# Patient Record
Sex: Female | Born: 1950 | Race: White | Hispanic: No | Marital: Married | Smoking: Never smoker
Health system: Southern US, Community
[De-identification: ages and names within clinical notes are randomized; demographics above are authoritative.]

## PROBLEM LIST (undated history)

## (undated) DIAGNOSIS — M8580 Other specified disorders of bone density and structure, unspecified site: Secondary | ICD-10-CM

## (undated) DIAGNOSIS — M1990 Unspecified osteoarthritis, unspecified site: Secondary | ICD-10-CM

## (undated) DIAGNOSIS — S92909A Unspecified fracture of unspecified foot, initial encounter for closed fracture: Secondary | ICD-10-CM

## (undated) DIAGNOSIS — E119 Type 2 diabetes mellitus without complications: Secondary | ICD-10-CM

## (undated) DIAGNOSIS — N3090 Cystitis, unspecified without hematuria: Secondary | ICD-10-CM

## (undated) DIAGNOSIS — N809 Endometriosis, unspecified: Secondary | ICD-10-CM

## (undated) DIAGNOSIS — K219 Gastro-esophageal reflux disease without esophagitis: Secondary | ICD-10-CM

## (undated) HISTORY — DX: Type 2 diabetes mellitus without complications: E11.9

## (undated) HISTORY — PX: LAPAROSCOPY: SHX197

## (undated) HISTORY — DX: Other specified disorders of bone density and structure, unspecified site: M85.80

## (undated) HISTORY — DX: Cystitis, unspecified without hematuria: N30.90

## (undated) HISTORY — DX: Unspecified osteoarthritis, unspecified site: M19.90

## (undated) HISTORY — DX: Endometriosis, unspecified: N80.9

## (undated) HISTORY — PX: DILATION AND CURETTAGE OF UTERUS: SHX78

## (undated) HISTORY — DX: Gastro-esophageal reflux disease without esophagitis: K21.9

## (undated) HISTORY — PX: CHOLECYSTECTOMY: SHX55

## (undated) HISTORY — DX: Unspecified fracture of unspecified foot, initial encounter for closed fracture: S92.909A

---

## 1998-07-07 ENCOUNTER — Other Ambulatory Visit: Admission: RE | Admit: 1998-07-07 | Discharge: 1998-07-07 | Payer: Self-pay | Admitting: Family Medicine

## 1999-08-06 ENCOUNTER — Encounter: Admission: RE | Admit: 1999-08-06 | Discharge: 1999-08-06 | Payer: Self-pay | Admitting: Family Medicine

## 1999-08-06 ENCOUNTER — Encounter: Payer: Self-pay | Admitting: Family Medicine

## 1999-10-15 ENCOUNTER — Other Ambulatory Visit: Admission: RE | Admit: 1999-10-15 | Discharge: 1999-10-15 | Payer: Self-pay | Admitting: Family Medicine

## 2004-05-16 ENCOUNTER — Encounter: Admission: RE | Admit: 2004-05-16 | Discharge: 2004-05-16 | Payer: Self-pay | Admitting: Family Medicine

## 2004-05-22 ENCOUNTER — Encounter: Admission: RE | Admit: 2004-05-22 | Discharge: 2004-05-22 | Payer: Self-pay | Admitting: Family Medicine

## 2004-06-22 ENCOUNTER — Other Ambulatory Visit: Admission: RE | Admit: 2004-06-22 | Discharge: 2004-06-22 | Payer: Self-pay | Admitting: Obstetrics and Gynecology

## 2005-05-29 ENCOUNTER — Encounter: Admission: RE | Admit: 2005-05-29 | Discharge: 2005-05-29 | Payer: Self-pay | Admitting: Obstetrics and Gynecology

## 2005-07-01 ENCOUNTER — Other Ambulatory Visit: Admission: RE | Admit: 2005-07-01 | Discharge: 2005-07-01 | Payer: Self-pay | Admitting: Obstetrics & Gynecology

## 2006-06-02 ENCOUNTER — Encounter: Admission: RE | Admit: 2006-06-02 | Discharge: 2006-06-02 | Payer: Self-pay | Admitting: Family Medicine

## 2006-08-18 ENCOUNTER — Other Ambulatory Visit: Admission: RE | Admit: 2006-08-18 | Discharge: 2006-08-18 | Payer: Self-pay | Admitting: Obstetrics & Gynecology

## 2007-08-20 ENCOUNTER — Other Ambulatory Visit: Admission: RE | Admit: 2007-08-20 | Discharge: 2007-08-20 | Payer: Self-pay | Admitting: Obstetrics & Gynecology

## 2008-09-15 ENCOUNTER — Other Ambulatory Visit: Admission: RE | Admit: 2008-09-15 | Discharge: 2008-09-15 | Payer: Self-pay | Admitting: Obstetrics & Gynecology

## 2010-01-29 ENCOUNTER — Ambulatory Visit: Payer: Self-pay | Admitting: Podiatry

## 2011-04-15 ENCOUNTER — Encounter: Payer: Self-pay | Admitting: Internal Medicine

## 2011-04-19 ENCOUNTER — Encounter: Payer: Self-pay | Admitting: Internal Medicine

## 2011-04-19 ENCOUNTER — Ambulatory Visit (INDEPENDENT_AMBULATORY_CARE_PROVIDER_SITE_OTHER): Payer: BC Managed Care – PPO | Admitting: Internal Medicine

## 2011-04-19 VITALS — BP 118/70 | HR 69 | Ht 62.75 in | Wt 149.8 lb

## 2011-04-19 DIAGNOSIS — E785 Hyperlipidemia, unspecified: Secondary | ICD-10-CM

## 2011-04-19 DIAGNOSIS — R011 Cardiac murmur, unspecified: Secondary | ICD-10-CM

## 2011-04-19 DIAGNOSIS — E119 Type 2 diabetes mellitus without complications: Secondary | ICD-10-CM

## 2011-04-19 DIAGNOSIS — R9431 Abnormal electrocardiogram [ECG] [EKG]: Secondary | ICD-10-CM

## 2011-04-19 DIAGNOSIS — I10 Essential (primary) hypertension: Secondary | ICD-10-CM

## 2011-04-19 NOTE — Patient Instructions (Signed)
Your physician has requested that you have an echocardiogram. Echocardiography is a painless test that uses sound waves to create images of your heart. It provides your doctor with information about the size and shape of your heart and how well your heart's chambers and valves are working. This procedure takes approximately one hour. There are no restrictions for this procedure.   

## 2011-04-21 ENCOUNTER — Encounter: Payer: Self-pay | Admitting: Internal Medicine

## 2011-04-21 DIAGNOSIS — E119 Type 2 diabetes mellitus without complications: Secondary | ICD-10-CM | POA: Insufficient documentation

## 2011-04-21 DIAGNOSIS — R9431 Abnormal electrocardiogram [ECG] [EKG]: Secondary | ICD-10-CM | POA: Insufficient documentation

## 2011-04-21 DIAGNOSIS — E785 Hyperlipidemia, unspecified: Secondary | ICD-10-CM | POA: Insufficient documentation

## 2011-04-21 DIAGNOSIS — I10 Essential (primary) hypertension: Secondary | ICD-10-CM | POA: Insufficient documentation

## 2011-04-21 NOTE — Assessment & Plan Note (Signed)
BP good control.  ACEi with history DM.

## 2011-04-21 NOTE — Assessment & Plan Note (Signed)
EKG with PVCs.  Otherwise normal.  I am not too concerned.  Patient is active(though could/should exercise more).  Asymptomatic Will be getting echo to evaluate murmur.  LVEF will be evaluated.  If normal no other work up.

## 2011-04-21 NOTE — Assessment & Plan Note (Signed)
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  Will get echo to evaluate.  Will also get info on LVEF from echo (with PVCs on EKG).

## 2011-04-21 NOTE — Assessment & Plan Note (Signed)
Agree with aggressive control of lipids.  Lipitor was started only 1 month ago.  May be able to back down on dose  Needs to be followed.

## 2011-04-21 NOTE — Progress Notes (Signed)
HPI Patient is a 60 year old who was referred by Dr. Elease Hashimoto for abnormal EKG The patient has no known cardiac history of diabetes (since 1999; started on insulin in 2012), dyslipidemia, She walks daily 3.2 miles/ 1 hour. She deneis SOB  No Chest pain.  No palpitations.  She was seen in medicine clinic recently  EKG showed. SR with PVCs. She was started on lipitor 1 month ago  Lipids a few days ago showed LDL of 71, HDL of 41. Trig were 62.  Hgb A1C was 7.1 (patient having trouble with low sugars.  Eats a lot of sugar during these episodes.  Insulin being adjusted).    Allergies  Allergen Reactions  . Erythromycin     Chest pain    Current Outpatient Prescriptions  Medication Sig Dispense Refill  . aspirin 81 MG tablet Take 81 mg by mouth 2 (two) times daily.        Marland Kitchen atorvastatin (LIPITOR) 10 MG tablet Take 10 mg by mouth daily.       . Calcium-Vitamin D-Vitamin K (VIACTIV) 500-500-40 MG-UNT-MCG CHEW Chew 500 mg by mouth daily.        Marland Kitchen estrogens, conjugated, (PREMARIN) 1.25 MG tablet Take 1.25 mg by mouth once a week.        . fish oil-omega-3 fatty acids 1000 MG capsule Take 1 g by mouth 4 (four) times daily - after meals and at bedtime.        Marland Kitchen HUMALOG MIX 75/25 KWIKPEN (75-25) 100 UNIT/ML SUSP 16 Units 2 (two) times daily. 18 units in the afternoon       . lansoprazole (PREVACID) 30 MG capsule Take 30 mg by mouth daily. everyother other       . lisinopril (PRINIVIL,ZESTRIL) 5 MG tablet Take 5 mg by mouth daily.       . metFORMIN (GLUCOPHAGE-XR) 500 MG 24 hr tablet Take 500 mg by mouth 4 (four) times daily. 2 tablets in the morning and 2 tablets at night      . nabumetone (RELAFEN) 500 MG tablet Take 500 mg by mouth 2 (two) times daily as needed.         No past medical history on file.  No past surgical history on file.  No family history on file.  History   Social History  . Marital Status: Married    Spouse Name: N/A    Number of Children: N/A  . Years of  Education: N/A   Occupational History  . Not on file.   Social History Main Topics  . Smoking status: Never Smoker   . Smokeless tobacco: Not on file  . Alcohol Use: Not on file  . Drug Use: Not on file  . Sexually Active: Not on file   Other Topics Concern  . Not on file   Social History Narrative  . No narrative on file    Review of Systems:  All systems reviewed.  They are negative to the above problem except as previously stated.  Vital Signs: BP 118/70  Pulse 69  Ht 5' 2.75" (1.594 m)  Wt 149 lb 12.8 oz (67.949 kg)  BMI 26.75 kg/m2  Physical Exam  Patient is in NAD HEENT:  Normocephalic, atraumatic. EOMI, PERRLA.  Neck: JVP is normal. No thyromegaly. No bruits.  Lungs: clear to auscultation. No rales no wheezes.  Heart: Regular rate and rhythm. Normal S1, S2. No S3.   Gr II/VI systolic murmur at base.Marland Kitchen PMI not displaced.  Abdomen:  Supple,  nontender. Normal bowel sounds. No masses. No hepatomegaly.  Extremities:   Good distal pulses throughout. No lower extremity edema.  Musculoskeletal :moving all extremities.  Neuro:   alert and oriented x3.  CN II-XII grossly intact.  EKG:  Sinus rhythm.  69 bpm.   Assessment and Plan:

## 2011-04-23 ENCOUNTER — Ambulatory Visit (HOSPITAL_COMMUNITY): Payer: BC Managed Care – PPO | Attending: Cardiology

## 2011-04-23 DIAGNOSIS — I10 Essential (primary) hypertension: Secondary | ICD-10-CM | POA: Insufficient documentation

## 2011-04-23 DIAGNOSIS — I079 Rheumatic tricuspid valve disease, unspecified: Secondary | ICD-10-CM | POA: Insufficient documentation

## 2011-04-23 DIAGNOSIS — E785 Hyperlipidemia, unspecified: Secondary | ICD-10-CM | POA: Insufficient documentation

## 2011-04-23 DIAGNOSIS — Z8249 Family history of ischemic heart disease and other diseases of the circulatory system: Secondary | ICD-10-CM | POA: Insufficient documentation

## 2011-04-23 DIAGNOSIS — R011 Cardiac murmur, unspecified: Secondary | ICD-10-CM | POA: Insufficient documentation

## 2011-04-23 DIAGNOSIS — E119 Type 2 diabetes mellitus without complications: Secondary | ICD-10-CM | POA: Insufficient documentation

## 2011-05-10 NOTE — Progress Notes (Signed)
Addended by: Judithe Modest D on: 05/10/2011 03:26 PM   Modules accepted: Orders

## 2013-05-13 DIAGNOSIS — N3090 Cystitis, unspecified without hematuria: Secondary | ICD-10-CM

## 2013-05-13 HISTORY — DX: Cystitis, unspecified without hematuria: N30.90

## 2013-06-15 ENCOUNTER — Encounter: Payer: Self-pay | Admitting: Obstetrics & Gynecology

## 2013-06-17 ENCOUNTER — Ambulatory Visit (INDEPENDENT_AMBULATORY_CARE_PROVIDER_SITE_OTHER): Payer: BC Managed Care – PPO | Admitting: Obstetrics & Gynecology

## 2013-06-17 ENCOUNTER — Encounter: Payer: Self-pay | Admitting: Obstetrics & Gynecology

## 2013-06-17 ENCOUNTER — Telehealth: Payer: Self-pay | Admitting: Obstetrics & Gynecology

## 2013-06-17 VITALS — BP 138/72 | HR 84 | Resp 16 | Ht 63.0 in | Wt 152.0 lb

## 2013-06-17 DIAGNOSIS — Z1211 Encounter for screening for malignant neoplasm of colon: Secondary | ICD-10-CM

## 2013-06-17 DIAGNOSIS — Z01419 Encounter for gynecological examination (general) (routine) without abnormal findings: Secondary | ICD-10-CM

## 2013-06-17 DIAGNOSIS — Z1239 Encounter for other screening for malignant neoplasm of breast: Secondary | ICD-10-CM

## 2013-06-17 DIAGNOSIS — N390 Urinary tract infection, site not specified: Secondary | ICD-10-CM

## 2013-06-17 MED ORDER — LANSOPRAZOLE 30 MG PO CPDR: 30.0000 mg | DELAYED_RELEASE_CAPSULE | Freq: Every day | ORAL | Status: DC

## 2013-06-17 NOTE — Progress Notes (Signed)
63 y.o. G1P1 MarriedCaucasianF here for annual exam.  Had UTI for several weeks earlier this year.  Took awhile before it was diagnosed.  Symptoms better.  No TOC done.  Declines colonoscopy.  No VB.  HbA1C was 6.7 with Dr. Talmage NapBalan.  Has follow up next month.  Patient's last menstrual period was 07/11/1997.          Sexually active: no  The current method of family planning is none.    Exercising: yes  walking on treadmill Smoker:  no  Health Maintenance: Pap:  03/17/12 WNL/negative HR HPV History of abnormal Pap:  no MMG:  07/23/12 normal-scheduled for 07/21/13 Colonoscopy:  2002, declines  BMD:   06/30/08 TDaP:  02/16/07 Zostavax and Pneumovax: completed Screening Labs: PCP, Hb today: PCP, Urine today: PCP   reports that she has never smoked. She has never used smokeless tobacco. She reports that she does not drink alcohol or use illicit drugs.  Past Medical History  Diagnosis Date  . DM (diabetes mellitus)   . GERD (gastroesophageal reflux disease)   . Endometriosis     on laparoscopy  . Osteopenia   . Fracture of foot     and toes  . Bladder infection 1/15    Past Surgical History  Procedure Laterality Date  . Dilation and curettage of uterus    . Cholecystectomy      Current Outpatient Prescriptions  Medication Sig Dispense Refill  . aspirin 81 MG tablet Take 81 mg by mouth 2 (two) times daily.        Marland Kitchen. atorvastatin (LIPITOR) 10 MG tablet Take 10 mg by mouth daily.       . Calcium-Vitamin D-Vitamin K (VIACTIV) 500-500-40 MG-UNT-MCG CHEW Chew 500 mg by mouth daily.        . fish oil-omega-3 fatty acids 1000 MG capsule Take 1 g by mouth 4 (four) times daily - after meals and at bedtime.        . lansoprazole (PREVACID) 30 MG capsule Take 30 mg by mouth daily. everyother other       . lisinopril (PRINIVIL,ZESTRIL) 5 MG tablet Take 5 mg by mouth daily.       . metFORMIN (GLUCOPHAGE-XR) 500 MG 24 hr tablet Take 500 mg by mouth 4 (four) times daily. 2 tablets in the morning  and 2 tablets at night      . HUMALOG MIX 75/25 KWIKPEN (75-25) 100 UNIT/ML SUSP 16 Units 2 (two) times daily. 18 units in the afternoon       . metoprolol succinate (TOPROL-XL) 25 MG 24 hr tablet 25 mg. 1/2 tab daily      . nabumetone (RELAFEN) 500 MG tablet Take 500 mg by mouth 2 (two) times daily as needed.       Marland Kitchen. NOVOLOG MIX 70/30 FLEXPEN (70-30) 100 UNIT/ML Pen        No current facility-administered medications for this visit.    Family History  Problem Relation Age of Onset  . Hypertension Mother   . Hyperlipidemia Mother   . Heart disease Father   . Heart disease Brother   . Hypertension Brother   . Hyperlipidemia Brother   . Heart disease Maternal Grandmother   . Heart disease Maternal Grandfather   . Hypertension Brother   . Hyperlipidemia Brother   . Diabetes Maternal Grandmother   . Diabetes Paternal Grandmother   . Alcohol abuse Brother     ROS:  Pertinent items are noted in HPI.  Otherwise, a comprehensive ROS  was negative.  Exam:   BP 138/72  Pulse 84  Resp 16  Ht 5\' 3"  (1.6 m)  Wt 152 lb (68.947 kg)  BMI 26.93 kg/m2  LMP 07/11/1997  Height: 5\' 3"  (160 cm)  Ht Readings from Last 3 Encounters:  06/17/13 5\' 3"  (1.6 m)  04/19/11 5' 2.75" (1.594 m)    General appearance: alert, cooperative and appears stated age Head: Normocephalic, without obvious abnormality, atraumatic Neck: no adenopathy, supple, symmetrical, trachea midline and thyroid normal to inspection and palpation Lungs: clear to auscultation bilaterally Breasts: normal appearance, no masses or tenderness Heart: regular rate and rhythm Abdomen: soft, non-tender; bowel sounds normal; no masses,  no organomegaly Extremities: extremities normal, atraumatic, no cyanosis or edema Skin: Skin color, texture, turgor normal. No rashes or lesions Lymph nodes: Cervical, supraclavicular, and axillary nodes normal. No abnormal inguinal nodes palpated Neurologic: Grossly normal   Pelvic: External  genitalia:  no lesions              Urethra:  normal appearing urethra with no masses, tenderness or lesions              Bartholins and Skenes: normal                 Vagina: normal appearing vagina with normal color and discharge, no lesions              Cervix: no lesions              Pap taken: yes Bimanual Exam:  Uterus:  normal size, contour, position, consistency, mobility, non-tender              Adnexa: normal adnexa               Rectovaginal: Confirms               Anus:  normal sphincter tone, no lesions  A:  Well Woman with normal exam Diabetes, followed by Dr. Talmage Nap with much improved HbA1C recently. PMP, no HRT. GERD Recent UTIs  P:   Mammogram scheduled at [facility] in march.  Will add BMD to this apt.   pap smear obtained Urine culture Prevaciud 30mg  daily.  #90/4RF Labs all done with PCP and/or Dr. Talmage Nap return annually or prn  An After Visit Summary was printed and given to the patient.

## 2013-06-17 NOTE — Telephone Encounter (Signed)
Returning call to pharmacy.  Conflicting data in e-scribe. Clarified Prevacid 30 mg 1 po daily.  Routing to provider for final review. Patient agreeable to disposition. Will close encounter

## 2013-06-17 NOTE — Telephone Encounter (Signed)
Patient calling from pharmacy re: pharmacy unable to read RX ("Other one for Prevocette"). Please call the pharmacy to clarify.

## 2013-06-18 ENCOUNTER — Telehealth: Payer: Self-pay | Admitting: *Deleted

## 2013-06-18 LAB — URINE CULTURE
Colony Count: NO GROWTH
Organism ID, Bacteria: NO GROWTH

## 2013-06-18 NOTE — Telephone Encounter (Signed)
[facility] clarifications on Prevacid directions.   Do you want her to take Prevacid daily or every other day?  Please advise.

## 2013-06-18 NOTE — Telephone Encounter (Signed)
[facility]  I think she fixed this.  If fax is from today, please call and inform Prevacid 30mg  daily.  #90/4RF.

## 2013-06-18 NOTE — Telephone Encounter (Signed)
Fax was from today. Called  Pharmacy and it was fix.

## 2013-06-21 LAB — IPS PAP SMEAR ONLY

## 2013-06-24 NOTE — Patient Instructions (Signed)

## 2013-06-28 ENCOUNTER — Telehealth: Payer: Self-pay

## 2013-06-28 NOTE — Telephone Encounter (Signed)
lmtcb

## 2013-06-28 NOTE — Telephone Encounter (Signed)
Message copied by Elisha HeadlandNIX, Janit Cutter S on Mon Jun 28, 2013 10:42 AM ------      Message from: Jerene BearsMILLER, MARY S      Created: Thu Jun 24, 2013  5:44 AM       02 pap recall.  Inform urine culture was negative.  She had UTI that went undiagnosed for several weeks.  She had just finished treatment at [facility]. ------

## 2013-06-28 NOTE — Telephone Encounter (Signed)
Patient notified of all results. Has AEX scheduled for 2/16//kn

## 2013-07-14 ENCOUNTER — Telehealth: Payer: Self-pay

## 2013-07-14 NOTE — Telephone Encounter (Signed)
lmtcb

## 2013-07-14 NOTE — Telephone Encounter (Signed)
Patient confirmed she did get this, Jessica aware.

## 2013-07-14 NOTE — Telephone Encounter (Signed)
Message copied by Robley Fries on Wed Jul 14, 2013 10:01 AM ------      Message from: Ivar Drape M      Created: Mon Jul 12, 2013 12:36 PM      Regarding: IFOB kit       Willodean Rosenthal,            Can you confirm that this patient did get the IFOB kit on 06/17/13? I see her name on the log but no charge for that date of service. If she did get it, I can drop that charge for you. Just let me know. Thanks,            Janett Billow ------

## 2013-07-26 LAB — FECAL OCCULT BLOOD, IMMUNOCHEMICAL: IMMUNOLOGICAL FECAL OCCULT BLOOD TEST: NEGATIVE

## 2013-08-02 ENCOUNTER — Telehealth: Payer: Self-pay

## 2013-08-02 NOTE — Telephone Encounter (Signed)
Message copied by Elisha HeadlandNIX, Wendy Mikles S on Mon Aug 02, 2013  9:54 AM ------      Message from: Jerene BearsMILLER, MARY S      Created: Mon Jul 26, 2013  6:37 PM       Inform neg ------

## 2013-08-02 NOTE — Telephone Encounter (Signed)
Lmtcb//kn 

## 2013-08-02 NOTE — Telephone Encounter (Signed)
Patient returning Kelly's call. °

## 2013-08-05 NOTE — Telephone Encounter (Signed)
Spoke with patient, results given//kn

## 2013-08-05 NOTE — Telephone Encounter (Signed)
Patient notified of all results. 

## 2013-10-06 ENCOUNTER — Encounter: Payer: Self-pay | Admitting: Obstetrics & Gynecology

## 2014-03-14 ENCOUNTER — Encounter: Payer: Self-pay | Admitting: Obstetrics & Gynecology

## 2014-05-31 ENCOUNTER — Ambulatory Visit: Payer: Self-pay | Admitting: Family Medicine

## 2014-06-14 ENCOUNTER — Telehealth: Payer: Self-pay

## 2014-06-14 NOTE — Telephone Encounter (Signed)
[facility] for patient's Lansoprazole cap 30mg . Spoke with Barbette MerinoSariah H who states patient has a new ID number of [id number]. Prior authorization has been initiated. Waiting for faxed form to fill out for MD review and signature before fax.

## 2014-06-21 NOTE — Telephone Encounter (Signed)
Signed and back on your desk

## 2014-06-21 NOTE — Telephone Encounter (Signed)
Prior authorization for Lansoprazole to Dr.Miller's desk for review and signature.

## 2014-06-22 NOTE — Telephone Encounter (Signed)
Prior authorization faxed with cover sheet and confirmation to [employer] at [phone]. Will await response from insurance company.

## 2014-06-23 NOTE — Telephone Encounter (Signed)
[facility] for prior authorization of Lansoprazole which states that no prior authorization is needed for this rx. Forms to Dr.Miller for review and signature for scan.

## 2014-06-24 NOTE — Telephone Encounter (Signed)
Forms signed by Dr.Miller. Sent to scan.  Will close encounter.

## 2014-06-28 ENCOUNTER — Ambulatory Visit (INDEPENDENT_AMBULATORY_CARE_PROVIDER_SITE_OTHER): Payer: Medicare Other | Admitting: Obstetrics & Gynecology

## 2014-06-28 ENCOUNTER — Encounter: Payer: Self-pay | Admitting: Obstetrics & Gynecology

## 2014-06-28 VITALS — BP 136/86 | HR 72 | Resp 20 | Ht 63.0 in | Wt 149.0 lb

## 2014-06-28 DIAGNOSIS — Z01419 Encounter for gynecological examination (general) (routine) without abnormal findings: Secondary | ICD-10-CM

## 2014-06-28 DIAGNOSIS — Z124 Encounter for screening for malignant neoplasm of cervix: Secondary | ICD-10-CM

## 2014-06-28 MED ORDER — INSULIN LISPRO PROT & LISPRO (75-25 MIX) 100 UNIT/ML KWIKPEN: PEN_INJECTOR | SUBCUTANEOUS | Status: DC

## 2014-06-28 NOTE — Progress Notes (Signed)
64 y.o. G1P1 MarriedCaucasianF here for annual exam.  Doing well.  No vaginal bleeding.    Pt needs rx for insulin.  Rx was "messed up" from Dr. Willeen Cass office, the pharmacy, and medicare coverage.  Pt takes Humalog 75/25 but got Humalog 70/30 instead.  Needs one rx until next appt.  Last Hb A1C was 6.2.    PCP:  Dr. Elease Hashimoto.  Goes when only having problems   Patient's last menstrual period was 07/11/1997.          Sexually active: No.  The current method of family planning is post menopausal status.    Exercising: Yes.    Treadmill daily Smoker:  no  Health Maintenance: Pap:  06/2013 Neg History of abnormal Pap:  no MMG:  07/27/13 BIRADS1:neg Colonoscopy:  2002 - Normal.  Pt declines. BMD:   07/27/13 Osteopenia spine TDaP:  2008 Screening Labs: Endocrinology , Hb today: Endocrinology, Urine today: Endocrinology - [facility].    reports that she has never smoked. She has never used smokeless tobacco. She reports that she does not drink alcohol or use illicit drugs.  Past Medical History  Diagnosis Date  . DM (diabetes mellitus)   . GERD (gastroesophageal reflux disease)   . Endometriosis     on laparoscopy  . Osteopenia   . Fracture of foot     and toes  . Bladder infection 1/15    Past Surgical History  Procedure Laterality Date  . Dilation and curettage of uterus    . Cholecystectomy      Current Outpatient Prescriptions  Medication Sig Dispense Refill  . aspirin 81 MG tablet Take 81 mg by mouth 2 (two) times daily.      Marland Kitchen atorvastatin (LIPITOR) 10 MG tablet Take 10 mg by mouth daily.     . Calcium-Vitamin D-Vitamin K (VIACTIV) 500-500-40 MG-UNT-MCG CHEW Chew 500 mg by mouth daily.      Marland Kitchen esomeprazole (NEXIUM) 20 MG capsule Take 20 mg by mouth daily at 12 noon.    . fish oil-omega-3 fatty acids 1000 MG capsule Take 1 g by mouth 4 (four) times daily - after meals and at bedtime.      Marland Kitchen HUMALOG MIX 75/25 KWIKPEN (75-25) 100 UNIT/ML SUSP 16  Units 2 (two) times daily. 18 units in the afternoon     . lisinopril (PRINIVIL,ZESTRIL) 5 MG tablet Take 5 mg by mouth daily.     . metFORMIN (GLUCOPHAGE-XR) 500 MG 24 hr tablet Take 500 mg by mouth 4 (four) times daily. 2 tablets in the morning and 2 tablets at night    . metoprolol succinate (TOPROL-XL) 25 MG 24 hr tablet 25 mg. 1/2 tab daily    . Misc Natural Products (OSTEO BI-FLEX/5-LOXIN ADVANCED) TABS Take by mouth.    . nabumetone (RELAFEN) 500 MG tablet Take 500 mg by mouth 2 (two) times daily as needed.     Marland Kitchen NOVOLOG MIX 70/30 FLEXPEN (70-30) 100 UNIT/ML Pen      No current facility-administered medications for this visit.    Family History  Problem Relation Age of Onset  . Hypertension Mother   . Hyperlipidemia Mother   . Heart disease Father   . Heart disease Brother   . Hypertension Brother   . Hyperlipidemia Brother   . Heart disease Maternal Grandmother   . Heart disease Maternal Grandfather   . Hypertension Brother   . Hyperlipidemia Brother   . Diabetes Maternal Grandmother   . Diabetes Paternal  Grandmother   . Alcohol abuse Brother     ROS:  Pertinent items are noted in HPI.  Otherwise, a comprehensive ROS was negative.  Exam:   BP 136/86 mmHg  Pulse 72  Resp 20  Ht 5\' 3"  (1.6 m)  Wt 149 lb (67.586 kg)  BMI 26.40 kg/m2  LMP 07/11/1997  Weight change: -3#  Height: 5\' 3"  (160 cm)  Ht Readings from Last 3 Encounters:  06/28/14 5\' 3"  (1.6 m)  06/17/13 5\' 3"  (1.6 m)  04/19/11 5' 2.75" (1.594 m)    General appearance: alert, cooperative and appears stated age Head: Normocephalic, without obvious abnormality, atraumatic Neck: no adenopathy, supple, symmetrical, trachea midline and thyroid normal to inspection and palpation Lungs: clear to auscultation bilaterally Breasts: normal appearance, no masses or tenderness Heart: regular rate and rhythm Abdomen: soft, non-tender; bowel sounds normal; no masses,  no organomegaly Extremities: extremities normal,  atraumatic, no cyanosis or edema Skin: Skin color, texture, turgor normal. No rashes or lesions Lymph nodes: Cervical, supraclavicular, and axillary nodes normal. No abnormal inguinal nodes palpated Neurologic: Grossly normal   Pelvic: External genitalia:  no lesions              Urethra:  normal appearing urethra with no masses, tenderness or lesions              Bartholins and Skenes: normal                 Vagina: normal appearing vagina with normal color and discharge, no lesions              Cervix: no lesions              Pap taken: Yes.   Bimanual Exam:  Uterus:  normal size, contour, position, consistency, mobility, non-tender              Adnexa: normal adnexa and no mass, fullness, tenderness               Rectovaginal: Confirms               Anus:  normal sphincter tone, no lesions  Chaperone was present for exam.  A:  Well Woman with normal exam Diabetes, followed by Dr. Talmage NapBalan.  hBA1C much better over last two years.  PMP, no HRT GERD Hypertension/elevated lipids  P: MMG yearly.  [facility] Rx for insulin as requested by pt.  Humalog 75/25 18 units am/25 unitspm.  #1box/1RF for pt. Labs all done with PCP and/or Dr. Talmage NapBalan return annually or prn

## 2014-06-29 LAB — IPS PAP SMEAR ONLY

## 2014-12-01 ENCOUNTER — Encounter: Payer: Self-pay | Admitting: Family Medicine

## 2014-12-01 ENCOUNTER — Ambulatory Visit (INDEPENDENT_AMBULATORY_CARE_PROVIDER_SITE_OTHER): Payer: Medicare Other | Admitting: Family Medicine

## 2014-12-01 VITALS — BP 112/70 | HR 69 | Temp 97.5°F | Resp 16 | Wt 149.0 lb

## 2014-12-01 DIAGNOSIS — E113299 Type 2 diabetes mellitus with mild nonproliferative diabetic retinopathy without macular edema, unspecified eye: Secondary | ICD-10-CM | POA: Insufficient documentation

## 2014-12-01 DIAGNOSIS — M1990 Unspecified osteoarthritis, unspecified site: Secondary | ICD-10-CM

## 2014-12-01 DIAGNOSIS — E782 Mixed hyperlipidemia: Secondary | ICD-10-CM | POA: Insufficient documentation

## 2014-12-01 DIAGNOSIS — R011 Cardiac murmur, unspecified: Secondary | ICD-10-CM | POA: Insufficient documentation

## 2014-12-01 DIAGNOSIS — H5712 Ocular pain, left eye: Secondary | ICD-10-CM | POA: Diagnosis not present

## 2014-12-01 DIAGNOSIS — H00016 Hordeolum externum left eye, unspecified eyelid: Secondary | ICD-10-CM | POA: Diagnosis not present

## 2014-12-01 DIAGNOSIS — N809 Endometriosis, unspecified: Secondary | ICD-10-CM | POA: Insufficient documentation

## 2014-12-01 DIAGNOSIS — K219 Gastro-esophageal reflux disease without esophagitis: Secondary | ICD-10-CM | POA: Insufficient documentation

## 2014-12-01 DIAGNOSIS — H269 Unspecified cataract: Secondary | ICD-10-CM | POA: Insufficient documentation

## 2014-12-01 HISTORY — DX: Unspecified osteoarthritis, unspecified site: M19.90

## 2014-12-01 MED ORDER — SULFACETAMIDE SODIUM 10 % OP SOLN: 2.0000 [drp] | OPHTHALMIC | Status: DC

## 2014-12-01 MED ORDER — AMOXICILLIN-POT CLAVULANATE 875-125 MG PO TABS: 1.0000 | ORAL_TABLET | Freq: Two times a day (BID) | ORAL | Status: DC

## 2014-12-01 NOTE — Progress Notes (Signed)
Subjective:    Patient ID: Amanda Mayo, female    DOB: 1951-05-11, 64 y.o.   MRN: [id number]  Eye Pain  The left eye is affected. The current episode started in the past 7 days. Associated symptoms include an eye discharge. Pertinent negatives include no fever.  Started in left eye at lid about 3 weeks ago at lower lid, stye. Resolved.  Now it popped up more medially. Getting worse. Red. Itchy. Painful.  Worse with closing eye. Drainage. Using hot compresses. No systemic symptoms. No fevers or sinus pain.  No vision changes.     Review of Systems  Constitutional: Negative for fever, chills and fatigue.  HENT: Positive for ear discharge and facial swelling. Negative for congestion, drooling, ear pain, postnasal drip, rhinorrhea and sinus pressure.   Eyes: Positive for pain, discharge and itching. Negative for visual disturbance.  Skin: Positive for rash (below eye lid on left. ).   Patient Active Problem List   Diagnosis Date Noted  . Diabetic retinopathy, nonproliferative, mild 12/01/2014  . 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 Acid reflux 12/01/2014  . Endometriosis 12/01/2014  . Cataract 12/01/2014  . Cardiac murmur 12/01/2014  . Arthritis 12/01/2014  . Dyslipidemia 04/21/2011  . Hypertension 04/21/2011  . Abnormal EKG 04/21/2011  . DM (diabetes mellitus) 04/21/2011   Past Medical History  Diagnosis Date  . DM (diabetes mellitus)   . GERD (gastroesophageal reflux disease)   . Endometriosis     on laparoscopy  . Osteopenia   . Fracture of foot     and toes  . Bladder infection 1/15  . Arthritis 12/01/2014   Current Outpatient Prescriptions on File Prior to Visit  Medication Sig  . aspirin 81 MG tablet Take 81 mg by mouth 2 (two) times daily.    Marland Kitchen atorvastatin (LIPITOR) 10 MG tablet Take 10 mg by mouth daily.   . Calcium-Vitamin D-Vitamin K (VIACTIV) 500-500-40 MG-UNT-MCG CHEW Chew 500 mg by mouth daily.    . fish oil-omega-3 fatty acids 1000 MG capsule Take  1 g by mouth 4 (four) times daily - after meals and at bedtime.    . Insulin Lispro Prot & Lispro (HUMALOG MIX 75/25 KWIKPEN) (75-25) 100 UNIT/ML Kwikpen 18 units in am and 24 units in PM  . lisinopril (PRINIVIL,ZESTRIL) 5 MG tablet Take 5 mg by mouth daily.   . metFORMIN (GLUCOPHAGE-XR) 500 MG 24 hr tablet Take 500 mg by mouth 4 (four) times daily. 2 tablets in the morning and 2 tablets at night   No current facility-administered medications on file prior to visit.   Allergies  Allergen Reactions  . Erythromycin     Chest pain   Past Surgical History  Procedure Laterality Date  . Dilation and curettage of uterus    . Cholecystectomy    . Laparoscopy     History   Social History  . Marital Status: Married    Spouse Name: N/A  . Number of Children: 1  . Years of Education: H/S   Occupational History  .      Retired   Social History Main Topics  . Smoking status: Never Smoker   . Smokeless tobacco: Never Used  . Alcohol Use: No  . Drug Use: No  . Sexual Activity: No   Other Topics Concern  . Not on file   Social History Narrative   Family History  Problem Relation Age of Onset  . Hypertension Mother   . Hyperlipidemia Mother   .  Arthritis Mother   . Anemia Mother   . Heart disease Father   . Diabetes Father   . Congestive Heart Failure Father   . Ulcers Father   . Anxiety disorder Father   . Hypertension Brother   . Hyperlipidemia Brother   . Bladder Cancer Brother   . COPD Brother   . Diabetes Maternal Grandmother   . Heart attack Maternal Grandmother   . Heart disease Maternal Grandfather   . Hypertension Brother   . Hyperlipidemia Brother   . Alcohol abuse Brother   . Allergy (severe) Sister   . Alcohol abuse Paternal Grandfather      .     Objective:   Physical Exam  Constitutional: She appears well-developed and well-nourished.  Eyes:    Cardiovascular: Normal rate and regular rhythm.   Pulmonary/Chest: Effort normal and breath sounds  normal.  BP 112/70 mmHg  Pulse 69  Temp(Src) 97.5 F (36.4 C) (Oral)  Resp 16  Wt 149 lb (67.586 kg)  LMP 07/11/1997     Assessment & Plan:  1. Eye pain, left Related to stye with mild cellulitis around it.   2. Stye external, left Condition is worsening. Will start medication for better control.  Failed conservative treatment with cold compress.  Please call back if condition worsens or does not continue to improve.    - amoxicillin-clavulanate (AUGMENTIN) 875-125 MG per tablet; Take 1 tablet by mouth 2 (two) times daily.  Dispense: 20 tablet; Refill: 0 - sulfacetamide (BLEPH-10) 10 % ophthalmic solution; Place 2 drops into the left eye every 3 (three) hours.  Dispense: 15 mL; Refill: 0  Lorie Phenix, MD

## 2015-08-08 ENCOUNTER — Encounter: Payer: Self-pay | Admitting: Family Medicine

## 2016-02-23 HISTORY — PX: CATARACT EXTRACTION W/ INTRAOCULAR LENS IMPLANT: SHX1309

## 2016-04-02 HISTORY — PX: CATARACT EXTRACTION W/ INTRAOCULAR LENS IMPLANT: SHX1309

## 2016-08-21 ENCOUNTER — Encounter: Payer: Self-pay | Admitting: Physician Assistant

## 2016-09-12 ENCOUNTER — Ambulatory Visit (INDEPENDENT_AMBULATORY_CARE_PROVIDER_SITE_OTHER): Payer: Medicare Other | Admitting: Obstetrics & Gynecology

## 2016-09-12 ENCOUNTER — Other Ambulatory Visit (HOSPITAL_COMMUNITY)
Admission: RE | Admit: 2016-09-12 | Discharge: 2016-09-12 | Disposition: A | Discharge status: Home / Self Care | Payer: Medicare Other | Source: Ambulatory Visit | Attending: Obstetrics & Gynecology | Admitting: Obstetrics & Gynecology

## 2016-09-12 ENCOUNTER — Encounter: Payer: Self-pay | Admitting: Obstetrics & Gynecology

## 2016-09-12 VITALS — BP 136/70 | HR 90 | Resp 16 | Ht 63.25 in | Wt 152.0 lb

## 2016-09-12 DIAGNOSIS — Z01419 Encounter for gynecological examination (general) (routine) without abnormal findings: Secondary | ICD-10-CM

## 2016-09-12 DIAGNOSIS — Z124 Encounter for screening for malignant neoplasm of cervix: Secondary | ICD-10-CM | POA: Insufficient documentation

## 2016-09-12 DIAGNOSIS — Z23 Encounter for immunization: Secondary | ICD-10-CM | POA: Diagnosis not present

## 2016-09-12 DIAGNOSIS — Z1211 Encounter for screening for malignant neoplasm of colon: Secondary | ICD-10-CM

## 2016-09-12 NOTE — Progress Notes (Signed)
66 y.o. [id number] MarriedCaucasianF here for annual exam.  Doing well.  No vaginal bleeding.  Endocrinology:  Dr. Talmage NapBalan.  Last visit was a few months ago.  HbA1C was 7.4.  Had recent knee injury and was not capable of doing exercise.  Was scheduled to see ortho.  Exercising again.  Does not have knee pain.    PCP:  Dr. Elease HashimotoMaloney  Patient's last menstrual period was 07/11/1997.          Sexually active: No.  The current method of family planning is post menopausal status.    Exercising: Yes.    Walking Smoker:  no  Health Maintenance: Pap:  06/28/14 Neg,  06/17/13 Neg  History of abnormal Pap:  no MMG:  08/05/16 Negative, did 3D Colonoscopy:  2002 Normal. Patient declines.  Willing to do Cologuard. BMD:   07/27/13 mild osteopenia  TDaP:  02/2007.  Will update this today Pneumonia vaccine(s):  01/2012 Zostavax:   10/2009 Hep C testing: not done. Patient declines  Screening Labs: PCP   reports that she has never smoked. She has never used smokeless tobacco. She reports that she does not drink alcohol or use drugs.  Past Medical History:  Diagnosis Date  . Arthritis 12/01/2014  . Bladder infection 1/15  . DM (diabetes mellitus) (HCC)   . Endometriosis    on laparoscopy  . Fracture of foot    and toes  . GERD (gastroesophageal reflux disease)   . Osteopenia     Past Surgical History:  Procedure Laterality Date  . CATARACT EXTRACTION W/ INTRAOCULAR LENS IMPLANT Right 02/23/2016  . CATARACT EXTRACTION W/ INTRAOCULAR LENS IMPLANT Left 04/02/2016  . CHOLECYSTECTOMY    . DILATION AND CURETTAGE OF UTERUS    . LAPAROSCOPY      Current Outpatient Prescriptions  Medication Sig Dispense Refill  . aspirin 81 MG tablet Take 81 mg by mouth 2 (two) times daily.      Marland Kitchen. atorvastatin (LIPITOR) 10 MG tablet Take 10 mg by mouth daily.     . Calcium-Vitamin D-Vitamin K (VIACTIV) 500-500-40 MG-UNT-MCG CHEW Chew 500 mg by mouth daily.      . fish oil-omega-3 fatty acids 1000 MG capsule Take 1 g by  mouth 4 (four) times daily - after meals and at bedtime.      . Glucosamine-Chondroitin (OSTEO BI-FLEX REGULAR STRENGTH PO) Take 1 tablet by mouth daily.    . Insulin Lispro Prot & Lispro (HUMALOG MIX 75/25 KWIKPEN) (75-25) 100 UNIT/ML Kwikpen 18 units in am and 24 units in PM 15 mL 1  . lisinopril (PRINIVIL,ZESTRIL) 5 MG tablet Take 5 mg by mouth daily.     . metFORMIN (GLUCOPHAGE-XR) 500 MG 24 hr tablet Take 500 mg by mouth 4 (four) times daily. 2 tablets in the morning and 2 tablets at night    . metoprolol succinate (TOPROL-XL) 25 MG 24 hr tablet Take 1 tablet by mouth daily.    . pantoprazole (PROTONIX) 40 MG tablet Take 40 mg by mouth daily.    Marland Kitchen. sulfacetamide (BLEPH-10) 10 % ophthalmic solution Place 2 drops into the left eye every 3 (three) hours. 15 mL 0   No current facility-administered medications for this visit.     Family History  Problem Relation Age of Onset  . Hypertension Mother   . Hyperlipidemia Mother   . Arthritis Mother   . Anemia Mother   . Heart disease Father   . Diabetes Father   . Congestive Heart Failure Father   .  Ulcers Father   . Anxiety disorder Father   . Hypertension Brother   . Hyperlipidemia Brother   . Bladder Cancer Brother   . COPD Brother   . Diabetes Maternal Grandmother   . Heart attack Maternal Grandmother   . Heart disease Maternal Grandfather   . Hypertension Brother   . Hyperlipidemia Brother   . Alcohol abuse Brother   . Allergy (severe) Sister   . Alcohol abuse Paternal Grandfather     ROS:  Pertinent items are noted in HPI.  Otherwise, a comprehensive ROS was negative.  Exam:   BP 136/70 (BP Location: Right Arm, Patient Position: Sitting, Cuff Size: Normal)   Pulse 90   Resp 16   Ht 5' 3.25" (1.607 m)   Wt 152 lb (68.9 kg)   LMP 07/11/1997   BMI 26.71 kg/m   Weight change: +3#  Height: 5' 3.25" (160.7 cm)  Ht Readings from Last 3 Encounters:  09/12/16 5' 3.25" (1.607 m)  05/31/14 5\' 4"  (1.626 m)  06/28/14 5\' 3"  (1.6  m)    General appearance: alert, cooperative and appears stated age Head: Normocephalic, without obvious abnormality, atraumatic Neck: no adenopathy, supple, symmetrical, trachea midline and thyroid normal to inspection and palpation Lungs: clear to auscultation bilaterally Breasts: normal appearance, no masses or tenderness Heart: regular rate and rhythm Abdomen: soft, non-tender; bowel sounds normal; no masses,  no organomegaly Extremities: extremities normal, atraumatic, no cyanosis or edema Skin: Skin color, texture, turgor normal. No rashes or lesions Lymph nodes: Cervical, supraclavicular, and axillary nodes normal. No abnormal inguinal nodes palpated Neurologic: Grossly normal   Pelvic: External genitalia:  no lesions              Urethra:  normal appearing urethra with no masses, tenderness or lesions              Bartholins and Skenes: normal                 Vagina: normal appearing vagina with normal color and discharge, no lesions, tightness at apex noted              Cervix: no lesions              Pap taken: Yes.   Bimanual Exam:  Uterus:  normal size, contour, position, consistency, mobility, non-tender              Adnexa: no mass, fullness, tenderness               Rectovaginal: Confirms               Anus:  normal sphincter tone, no lesions  Chaperone was present for exam.  A:  Well Woman with normal exam Diabetes, followed by Dr. Talmage Nap PMP, no HRT GERD Hypertension Elevated lipids  P:   mammogram guidelines reviewed pap smear obtained today Tdap update today Pt continues to declined colonoscopy but is willing to do a Cologaurd this year.  Order signed.  Weakness of test was reviewed. Declines blood work.  Recommended having a CBC done with next blood work. return annually or prn

## 2016-09-13 LAB — CYTOLOGY - PAP: DIAGNOSIS: NEGATIVE

## 2016-10-05 LAB — COLOGUARD: Cologuard: NEGATIVE

## 2016-10-11 NOTE — Addendum Note (Signed)
Addended by: Loreta AveMORALES, Ethelmae Ringel C on: 10/11/2016 11:57 AM   Modules accepted: Orders

## 2017-08-06 ENCOUNTER — Telehealth: Payer: Self-pay

## 2017-08-06 NOTE — Telephone Encounter (Signed)
LMTCB, unsure if pt still wants to continue care here. If so, pt is eligible for an AWV. Asked pt to let us know either way. -MM

## 2017-08-08 LAB — HM MAMMOGRAPHY

## 2017-08-20 NOTE — Telephone Encounter (Signed)
Spoke with pt who states she has not been to our clinic in awhile due to wanting a female MD and she has been having her wellness visits with her GYN. Advised pt about Dr. Leonard SchwartzB. Pt declined AWV and CPE and states she will continue receiving her wellness visits with GYN and will call us if she needs us for other concerns. Pt will want to see Dr. B if so. -MM

## 2019-07-15 ENCOUNTER — Ambulatory Visit: Payer: Medicare Other | Admitting: Obstetrics & Gynecology

## 2019-08-24 ENCOUNTER — Observation Stay
Admission: EM | Admit: 2019-08-24 | Discharge: 2019-08-25 | Disposition: A | Discharge status: Home / Self Care | Payer: Medicare Other | Attending: Internal Medicine | Admitting: Internal Medicine

## 2019-08-24 ENCOUNTER — Other Ambulatory Visit: Payer: Self-pay

## 2019-08-24 ENCOUNTER — Encounter: Payer: Self-pay | Admitting: Radiology

## 2019-08-24 ENCOUNTER — Observation Stay: Payer: Medicare Other

## 2019-08-24 ENCOUNTER — Emergency Department: Payer: Medicare Other

## 2019-08-24 DIAGNOSIS — M542 Cervicalgia: Secondary | ICD-10-CM | POA: Insufficient documentation

## 2019-08-24 DIAGNOSIS — E785 Hyperlipidemia, unspecified: Secondary | ICD-10-CM | POA: Diagnosis not present

## 2019-08-24 DIAGNOSIS — R4701 Aphasia: Secondary | ICD-10-CM | POA: Diagnosis present

## 2019-08-24 DIAGNOSIS — E119 Type 2 diabetes mellitus without complications: Secondary | ICD-10-CM | POA: Diagnosis not present

## 2019-08-24 DIAGNOSIS — R9431 Abnormal electrocardiogram [ECG] [EKG]: Secondary | ICD-10-CM | POA: Diagnosis not present

## 2019-08-24 DIAGNOSIS — K449 Diaphragmatic hernia without obstruction or gangrene: Secondary | ICD-10-CM | POA: Diagnosis not present

## 2019-08-24 DIAGNOSIS — G459 Transient cerebral ischemic attack, unspecified: Secondary | ICD-10-CM | POA: Diagnosis present

## 2019-08-24 DIAGNOSIS — Z794 Long term (current) use of insulin: Secondary | ICD-10-CM | POA: Insufficient documentation

## 2019-08-24 DIAGNOSIS — Z885 Allergy status to narcotic agent status: Secondary | ICD-10-CM | POA: Diagnosis not present

## 2019-08-24 DIAGNOSIS — K219 Gastro-esophageal reflux disease without esophagitis: Secondary | ICD-10-CM | POA: Diagnosis not present

## 2019-08-24 DIAGNOSIS — Z7982 Long term (current) use of aspirin: Secondary | ICD-10-CM | POA: Insufficient documentation

## 2019-08-24 DIAGNOSIS — I700 Atherosclerosis of aorta: Secondary | ICD-10-CM | POA: Diagnosis not present

## 2019-08-24 DIAGNOSIS — M1990 Unspecified osteoarthritis, unspecified site: Secondary | ICD-10-CM | POA: Insufficient documentation

## 2019-08-24 DIAGNOSIS — H538 Other visual disturbances: Secondary | ICD-10-CM | POA: Diagnosis not present

## 2019-08-24 DIAGNOSIS — Z20822 Contact with and (suspected) exposure to covid-19: Secondary | ICD-10-CM | POA: Diagnosis not present

## 2019-08-24 DIAGNOSIS — R531 Weakness: Secondary | ICD-10-CM | POA: Diagnosis not present

## 2019-08-24 DIAGNOSIS — Z881 Allergy status to other antibiotic agents status: Secondary | ICD-10-CM | POA: Diagnosis not present

## 2019-08-24 DIAGNOSIS — E042 Nontoxic multinodular goiter: Secondary | ICD-10-CM | POA: Insufficient documentation

## 2019-08-24 DIAGNOSIS — M8580 Other specified disorders of bone density and structure, unspecified site: Secondary | ICD-10-CM | POA: Insufficient documentation

## 2019-08-24 DIAGNOSIS — Z79899 Other long term (current) drug therapy: Secondary | ICD-10-CM | POA: Diagnosis not present

## 2019-08-24 DIAGNOSIS — Z8249 Family history of ischemic heart disease and other diseases of the circulatory system: Secondary | ICD-10-CM | POA: Insufficient documentation

## 2019-08-24 DIAGNOSIS — K439 Ventral hernia without obstruction or gangrene: Secondary | ICD-10-CM | POA: Diagnosis not present

## 2019-08-24 DIAGNOSIS — R0789 Other chest pain: Secondary | ICD-10-CM | POA: Diagnosis not present

## 2019-08-24 DIAGNOSIS — I10 Essential (primary) hypertension: Secondary | ICD-10-CM | POA: Diagnosis present

## 2019-08-24 DIAGNOSIS — R160 Hepatomegaly, not elsewhere classified: Secondary | ICD-10-CM | POA: Insufficient documentation

## 2019-08-24 DIAGNOSIS — Z833 Family history of diabetes mellitus: Secondary | ICD-10-CM | POA: Insufficient documentation

## 2019-08-24 DIAGNOSIS — R1110 Vomiting, unspecified: Secondary | ICD-10-CM

## 2019-08-24 LAB — DIFFERENTIAL
Abs Immature Granulocytes: 0.02 10*3/uL (ref 0.00–0.07)
Basophils Absolute: 0.1 10*3/uL (ref 0.0–0.1)
Basophils Relative: 1 %
Eosinophils Absolute: 0.1 10*3/uL (ref 0.0–0.5)
Eosinophils Relative: 1 %
Immature Granulocytes: 0 %
Lymphocytes Relative: 30 %
Lymphs Abs: 2.8 10*3/uL (ref 0.7–4.0)
Monocytes Absolute: 0.5 10*3/uL (ref 0.1–1.0)
Monocytes Relative: 6 %
Neutro Abs: 5.8 10*3/uL (ref 1.7–7.7)
Neutrophils Relative %: 62 %

## 2019-08-24 LAB — CBC
HCT: 39.6 % (ref 36.0–46.0)
Hemoglobin: 13 g/dL (ref 12.0–15.0)
MCH: 31.1 pg (ref 26.0–34.0)
MCHC: 32.8 g/dL (ref 30.0–36.0)
MCV: 94.7 fL (ref 80.0–100.0)
Platelets: 308 10*3/uL (ref 150–400)
RBC: 4.18 MIL/uL (ref 3.87–5.11)
RDW: 12.3 % (ref 11.5–15.5)
WBC: 9.3 10*3/uL (ref 4.0–10.5)
nRBC: 0 % (ref 0.0–0.2)

## 2019-08-24 LAB — COMPREHENSIVE METABOLIC PANEL
ALT: 20 U/L (ref 0–44)
AST: 16 U/L (ref 15–41)
Albumin: 4.1 g/dL (ref 3.5–5.0)
Alkaline Phosphatase: 60 U/L (ref 38–126)
Anion gap: 11 (ref 5–15)
BUN: 15 mg/dL (ref 8–23)
CO2: 24 mmol/L (ref 22–32)
Calcium: 9.3 mg/dL (ref 8.9–10.3)
Chloride: 103 mmol/L (ref 98–111)
Creatinine, Ser: 0.74 mg/dL (ref 0.44–1.00)
GFR calc Af Amer: >60 mL/min (ref >60)
GFR calc non Af Amer: >60 mL/min (ref >60)
Glucose, Bld: 167 mg/dL — ABNORMAL HIGH (ref 70–99)
Potassium: 4 mmol/L (ref 3.5–5.1)
Sodium: 138 mmol/L (ref 135–145)
Total Bilirubin: 0.9 mg/dL (ref 0.3–1.2)
Total Protein: 7.3 g/dL (ref 6.5–8.1)

## 2019-08-24 LAB — PROTIME-INR
INR: 1.1 (ref 0.8–1.2)
Prothrombin Time: 14 seconds (ref 11.4–15.2)

## 2019-08-24 LAB — TROPONIN I (HIGH SENSITIVITY)
Troponin I (High Sensitivity): 6 ng/L (ref <18)
Troponin I (High Sensitivity): 2 ng/L (ref <18)

## 2019-08-24 LAB — GLUCOSE, CAPILLARY
Glucose-Capillary: 153 mg/dL — ABNORMAL HIGH (ref 70–99)
Glucose-Capillary: 129 mg/dL — ABNORMAL HIGH (ref 70–99)

## 2019-08-24 LAB — APTT: aPTT: 28 seconds (ref 24–36)

## 2019-08-24 MED ORDER — ATORVASTATIN CALCIUM 20 MG PO TABS
10.0000 mg | ORAL_TABLET | Freq: Every day | ORAL | Status: DC
  Administered 2019-08-24: 10 mg via ORAL
  Filled 2019-08-24: qty 1

## 2019-08-24 MED ORDER — ACETAMINOPHEN 325 MG PO TABS
650.0000 mg | ORAL_TABLET | ORAL | Status: DC | PRN
  Filled 2019-08-24: qty 2

## 2019-08-24 MED ORDER — SODIUM CHLORIDE 0.9% FLUSH: 3.0000 mL | Freq: Once | INTRAVENOUS | Status: DC

## 2019-08-24 MED ORDER — LORAZEPAM 2 MG/ML IJ SOLN
0.5000 mg | Freq: Once | INTRAMUSCULAR | Status: AC
  Administered 2019-08-24: 0.5 mg via INTRAVENOUS
  Filled 2019-08-24: qty 1

## 2019-08-24 MED ORDER — INSULIN ASPART 100 UNIT/ML ~~LOC~~ SOLN
0.0000 [IU] | Freq: Three times a day (TID) | SUBCUTANEOUS | Status: DC
  Administered 2019-08-25: 5 [IU] via SUBCUTANEOUS
  Administered 2019-08-25: 3 [IU] via SUBCUTANEOUS
  Filled 2019-08-24 (×2): qty 1

## 2019-08-24 MED ORDER — ASPIRIN 81 MG PO CHEW
81.0000 mg | CHEWABLE_TABLET | Freq: Every day | ORAL | Status: DC
  Administered 2019-08-24 – 2019-08-25 (×2): 81 mg via ORAL
  Filled 2019-08-24 (×2): qty 1

## 2019-08-24 MED ORDER — ACETAMINOPHEN 650 MG RE SUPP: 650.0000 mg | RECTAL | Status: DC | PRN

## 2019-08-24 MED ORDER — STROKE: EARLY STAGES OF RECOVERY BOOK: Freq: Once | Status: AC

## 2019-08-24 MED ORDER — ENOXAPARIN SODIUM 40 MG/0.4ML ~~LOC~~ SOLN
40.0000 mg | SUBCUTANEOUS | Status: DC
  Administered 2019-08-24: 40 mg via SUBCUTANEOUS
  Filled 2019-08-24: qty 0.4

## 2019-08-24 MED ORDER — IOHEXOL 350 MG/ML SOLN
125.0000 mL | Freq: Once | INTRAVENOUS | Status: AC | PRN
  Administered 2019-08-24: 125 mL via INTRAVENOUS

## 2019-08-24 MED ORDER — ACETAMINOPHEN 160 MG/5ML PO SOLN: 650.0000 mg | ORAL | Status: DC | PRN

## 2019-08-24 MED ORDER — PANTOPRAZOLE SODIUM 40 MG PO TBEC
40.0000 mg | DELAYED_RELEASE_TABLET | Freq: Every day | ORAL | Status: DC
  Administered 2019-08-24 – 2019-08-25 (×2): 40 mg via ORAL
  Filled 2019-08-24 (×2): qty 1

## 2019-08-24 NOTE — ED Notes (Signed)
Attempt to call report X 1 unsuccessful.  

## 2019-08-24 NOTE — ED Provider Notes (Signed)
[facility] Emergency Department Provider Note    First MD Initiated Contact with Patient 08/24/19 1756     (approximate)  I have reviewed the triage vital signs and the nursing notes.   HISTORY  Chief Complaint Code Stroke    HPI Amanda Mayo is a 69 y.o. female presents the ER for evaluation of chest discomfort left face and left arm tingling and cramping sensation with some discomfort radiating through to her back associated with a headache.  Reportedly also was having slurred speech lasting roughly 15 minutes and the symptoms started somewhere between 1 and 2:00.  States that she also had similar symptoms 1 week ago was no headache then.  She does take daily aspirin.  Does have family history of diabetes and hypertension.  Denies any personal history of cardiac disease.  Says she is also having some right-sided chest discomfort.  Her symptoms have resolved at this time.  Was also complaining of some neck pain at the time.    Past Medical History:  Diagnosis Date  . Arthritis 12/01/2014  . Bladder infection 1/15  . DM (diabetes mellitus) (HCC)   . Endometriosis    on laparoscopy  . Fracture of foot    and toes  . GERD (gastroesophageal reflux disease)   . Osteopenia    Family History  Problem Relation Age of Onset  . Hypertension Mother   . Hyperlipidemia Mother   . Arthritis Mother   . Anemia Mother   . Heart disease Father   . Diabetes Father   . Congestive Heart Failure Father   . Ulcers Father   . Anxiety disorder Father   . Hypertension Brother   . Hyperlipidemia Brother   . Bladder Cancer Brother   . COPD Brother   . Diabetes Maternal Grandmother   . Heart attack Maternal Grandmother   . Heart disease Maternal Grandfather   . Hypertension Brother   . Hyperlipidemia Brother   . Alcohol abuse Brother   . Allergy (severe) Sister   . Alcohol abuse Paternal Grandfather    Past Surgical History:  Procedure Laterality Date  .  CATARACT EXTRACTION W/ INTRAOCULAR LENS IMPLANT Right 02/23/2016  . CATARACT EXTRACTION W/ INTRAOCULAR LENS IMPLANT Left 04/02/2016  . CHOLECYSTECTOMY    . DILATION AND CURETTAGE OF UTERUS    . LAPAROSCOPY     Patient Active Problem List   Diagnosis Date Noted  . Diabetic retinopathy, nonproliferative, mild (HCC) 12/01/2014  . 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 Acid reflux 12/01/2014  . Endometriosis 12/01/2014  . Cataract 12/01/2014  . Cardiac murmur 12/01/2014  . Arthritis 12/01/2014  . Dyslipidemia 04/21/2011  . Hypertension 04/21/2011  . Abnormal EKG 04/21/2011  . DM (diabetes mellitus) (HCC) 04/21/2011      Prior to Admission medications   Medication Sig Start Date End Date Taking? Authorizing Provider  aspirin 81 MG tablet Take 81 mg by mouth 2 (two) times daily.      [provider]  atorvastatin (LIPITOR) 10 MG tablet Take 10 mg by mouth daily.  03/25/11   [provider]  Calcium-Vitamin D-Vitamin K (VIACTIV) 500-500-40 MG-UNT-MCG CHEW Chew 500 mg by mouth daily.      [provider]  fish oil-omega-3 fatty acids 1000 MG capsule Take 1 g by mouth 4 (four) times daily - after meals and at bedtime.      [provider]  Glucosamine-Chondroitin (OSTEO BI-FLEX REGULAR STRENGTH PO) Take 1 tablet by mouth daily.  [provider]  lisinopril (PRINIVIL,ZESTRIL) 5 MG tablet Take 5 mg by mouth daily.  03/04/11   [provider]  metFORMIN (GLUCOPHAGE-XR) 500 MG 24 hr tablet Take 500 mg by mouth 4 (four) times daily. 2 tablets in the morning and 2 tablets at night 03/04/11   [provider]  metoprolol succinate (TOPROL-XL) 25 MG 24 hr tablet Take 1 tablet by mouth daily. 07/19/16   [provider]  pantoprazole (PROTONIX) 40 MG tablet Take 40 mg by mouth daily.    [provider]    Allergies Codeine and Erythromycin    Social History Social History   Tobacco Use  . Smoking status:  Never Smoker  . Smokeless tobacco: Never Used  Substance Use Topics  . Alcohol use: No  . Drug use: No    Review of Systems Patient denies headaches, rhinorrhea, blurry vision, numbness, shortness of breath, chest pain, edema, cough, abdominal pain, nausea, vomiting, diarrhea, dysuria, fevers, rashes or hallucinations unless otherwise stated above in HPI. ____________________________________________   PHYSICAL EXAM:  VITAL SIGNS: Vitals:   08/24/19 1752 08/24/19 1800  BP: (!) 185/75 140/75  Pulse: 92 84  Resp: (!) 23 20  Temp:  98.4 F (36.9 C)  SpO2: 100% 100%    Constitutional: Alert and oriented.  Eyes: Conjunctivae are normal.  Head: Atraumatic. Nose: No congestion/rhinnorhea. Mouth/Throat: Mucous membranes are moist.   Neck: No stridor. Painless ROM.  Cardiovascular: Normal rate, regular rhythm. Grossly normal heart sounds.  Good peripheral circulation. Respiratory: Normal respiratory effort.  No retractions. Lungs CTAB. Gastrointestinal: Soft and nontender. No distention. No abdominal bruits. No CVA tenderness. Genitourinary: deferred Musculoskeletal: No lower extremity tenderness nor edema.  No joint effusions. Neurologic:  CN- intact.  No facial droop, Normal FNF.  Normal heel to shin.  Sensation intact bilaterally. Normal speech and language. No gross focal neurologic deficits are appreciated. No gait instability. Skin:  Skin is warm, dry and intact. No rash noted. Psychiatric: Mood and affect are normal. Speech and behavior are normal.  ____________________________________________   LABS (all labs ordered are listed, but only abnormal results are displayed)  Results for orders placed or performed during the hospital encounter of 08/24/19 (from the past 24 hour(s))  Glucose, capillary     Status: Abnormal   Collection Time: 08/24/19  5:40 PM  Result Value Ref Range   Glucose-Capillary 153 (H) 70 - 99 mg/dL   Comment 1 Notify RN    Comment 2 Document in  Chart   Protime-INR     Status: None   Collection Time: 08/24/19  5:48 PM  Result Value Ref Range   Prothrombin Time 14.0 11.4 - 15.2 seconds   INR 1.1 0.8 - 1.2  APTT     Status: None   Collection Time: 08/24/19  5:48 PM  Result Value Ref Range   aPTT 28 24 - 36 seconds  CBC     Status: None   Collection Time: 08/24/19  5:48 PM  Result Value Ref Range   WBC 9.3 4.0 - 10.5 K/uL   RBC 4.18 3.87 - 5.11 MIL/uL   Hemoglobin 13.0 12.0 - 15.0 g/dL   HCT 95.6 38.7 - 56.4 %   MCV 94.7 80.0 - 100.0 fL   MCH 31.1 26.0 - 34.0 pg   MCHC 32.8 30.0 - 36.0 g/dL   RDW 33.2 95.1 - 88.4 %   Platelets 308 150 - 400 K/uL   nRBC 0.0 0.0 - 0.2 %  Differential  Status: None   Collection Time: 08/24/19  5:48 PM  Result Value Ref Range   Neutrophils Relative % 62 %   Neutro Abs 5.8 1.7 - 7.7 K/uL   Lymphocytes Relative 30 %   Lymphs Abs 2.8 0.7 - 4.0 K/uL   Monocytes Relative 6 %   Monocytes Absolute 0.5 0.1 - 1.0 K/uL   Eosinophils Relative 1 %   Eosinophils Absolute 0.1 0.0 - 0.5 K/uL   Basophils Relative 1 %   Basophils Absolute 0.1 0.0 - 0.1 K/uL   Immature Granulocytes 0 %   Abs Immature Granulocytes 0.02 0.00 - 0.07 K/uL  Comprehensive metabolic panel     Status: Abnormal   Collection Time: 08/24/19  5:48 PM  Result Value Ref Range   Sodium 138 135 - 145 mmol/L   Potassium 4.0 3.5 - 5.1 mmol/L   Chloride 103 98 - 111 mmol/L   CO2 24 22 - 32 mmol/L   Glucose, Bld 167 (H) 70 - 99 mg/dL   BUN 15 8 - 23 mg/dL   Creatinine, Ser 0.74 0.44 - 1.00 mg/dL   Calcium 9.3 8.9 - 10.3 mg/dL   Total Protein 7.3 6.5 - 8.1 g/dL   Albumin 4.1 3.5 - 5.0 g/dL   AST 16 15 - 41 U/L   ALT 20 0 - 44 U/L   Alkaline Phosphatase 60 38 - 126 U/L   Total Bilirubin 0.9 0.3 - 1.2 mg/dL   GFR calc non Af Amer >60 >60 mL/min   GFR calc Af Amer >60 >60 mL/min   Anion gap 11 5 - 15  Troponin I (High Sensitivity)     Status: None   Collection Time: 08/24/19  5:48 PM  Result Value Ref Range   Troponin I (High  Sensitivity) 6 <18 ng/L   ____________________________________________  EKG My review and personal interpretation at Time: 17:54   Indication: chest pain  Rate: 85  Rhythm: sinus Axis: normal Other: normal intervals, no stemi ____________________________________________  RADIOLOGY  I personally reviewed all radiographic images ordered to evaluate for the above acute complaints and reviewed radiology reports and findings.  These findings were personally discussed with the patient.  Please see medical record for radiology report.  ____________________________________________   PROCEDURES  Procedure(s) performed:  .1-3 Lead EKG Interpretation Performed by: Merlyn Lot, MD Authorized by: Merlyn Lot, MD     Interpretation: normal     ECG rate:  90   ECG rate assessment: normal     Rhythm: sinus rhythm     Ectopy: none     Conduction: normal        Critical Care performed: no ____________________________________________   INITIAL IMPRESSION / ASSESSMENT AND PLAN / ED COURSE  Pertinent labs & imaging results that were available during my care of the patient were reviewed by me and considered in my medical decision making (see chart for details).   DDX: cva, tia, hypoglycemia, dehydration, electrolyte abnormality, dissection, sepsis   CITLALI GAUTNEY is a 69 y.o. who presents to the ED with symptoms as described above.  Her neuro deficit have resolved.  Patient would not be a TPA candidate as she was having similar symptoms roughly 2 to 3 weeks ago.  As her symptoms are resolving I think this is more consistent with TIA however patient is also complaining of chest pain radiating through to her back and also having some neck discomfort therefore there is also concern possibility of dissection.  Will order CT angiogram as her  CT head is normal.  Anticipate patient will require hospitalization for TIA work-up with blood work and imaging here otherwise  reassuring.  Clinical Course as of Aug 24 1950  Tue Aug 24, 2019  1940 Patient's work-up shows no evidence of dissection.  Does have some atherosclerotic disease.  No evidence of ACS by troponin or EKG.  Does not have any dysrhythmia on the monitor but given her age and risk factors and symptoms concerning for TIA will discuss with hospitalist for admission.   [PR]    Clinical Course User Index [PR] Willy Eddy, MD    The patient was evaluated in Emergency Department today for the symptoms described in the history of present illness. He/she was evaluated in the context of the global COVID-19 pandemic, which necessitated consideration that the patient might be at risk for infection with the SARS-CoV-2 virus that causes COVID-19. Institutional protocols and algorithms that pertain to the evaluation of patients at risk for COVID-19 are in a state of rapid change based on information released by regulatory bodies including the CDC and federal and state organizations. These policies and algorithms were followed during the patient's care in the ED.  As part of my medical decision making, I reviewed the following data within the electronic MEDICAL RECORD NUMBER Nursing notes reviewed and incorporated, Labs reviewed, notes from prior ED visits and [place] Controlled Substance Database   ____________________________________________   FINAL CLINICAL IMPRESSION(S) / ED DIAGNOSES  Final diagnoses:  TIA (transient ischemic attack)      NEW MEDICATIONS STARTED DURING THIS VISIT:  New Prescriptions   No medications on file     Note:  This document was prepared using Dragon voice recognition software and may include unintentional dictation errors.    Willy Eddy, MD 08/24/19 [phone]

## 2019-08-24 NOTE — Progress Notes (Signed)
   08/24/19 1800  Clinical Encounter Type  Visited With Patient and family together  Visit Type Initial  Referral From Nurse  Consult/Referral To Chaplain  Spiritual Encounters  Spiritual Needs Prayer;Emotional  Stress Factors  Patient Stress Factors Health changes    CH visit with PT due to code stroke. PT was alert and talking. CH offered prayer for the PT.

## 2019-08-24 NOTE — ED Notes (Signed)
CODE STROKE CALLED TO [phone] 

## 2019-08-24 NOTE — H&P (Signed)
History and Physical    Amanda Mayo UUE:[id number] DOB: [birth year] DOA: 08/24/2019  PCP: Patient, No Pcp Per  Patient coming from: Home, lives with husband  I have personally briefly reviewed patient's old medical records in [facility]  Chief Complaint: aphasia, left arm numbness  HPI: Amanda SANTOYO is a 69 y.o. female with medical history significant for hypertension, hyperlipidemia and insulin-dependent type 2 diabetes who presents with concerns of aphasia and left arm numbness.  Patient reports that she has had similar symptoms about 2 years and then again 3 weeks ago.  3 weeks ago she had an episode where she had trouble speaking for about 5 minutes. She was not evaluated for any of those events. Today at about 1-2 PM she noticed sudden left arm numbness and aphasia.  Also noted right-sided "hard chest pain."  This episode lasted for about 15 minutes.  Prior to that she also noticed headache and had it during and after the episode.  Also endorsed blurry vision.  She denies feeling any nausea or vomiting.  No shortness of breath.  She takes 2 aspirin per day and took it this morning.  Patient denies tobacco, alcohol illicit drug use.  Family history Mother-heart issues Dad-heart issues and stroke and is deceased at 76 Brother has stroke and seizures and is 16 years old  ED Course: Patient is afebrile, initially hypertensive up to 185/75 on room air.  CBC was unremarkable.  Glucose of 167 but all other electrolytes unremarkable.  Troponin of 6.  She initially presented as a code stroke and had negative CT head.   CTA head and neck showed no acute intracranial abnormalities and no significant carotid or vertebral artery stenosis in the neck. CTA chest aorta was obtained which showed a 5.3 cm mass within the caudal lobe of the liver compatible with a hemangioma.  Review of Systems: Constitutional: No Weight Change, No Fever ENT/Mouth: No sore throat, No Rhinorrhea Eyes:  No Eye Pain, + Vision Changes Cardiovascular: + Chest Pain, no SOB Respiratory: No Cough, No Sputum Gastrointestinal: No Nausea, No Vomiting, No Diarrhea, No Constipation, No Pain Genitourinary: no Urinary Incontinence Musculoskeletal: No Arthralgias, No Myalgias Skin: No Skin Lesions, No Pruritus, Neuro: no Weakness, + Numbness,  No Loss of Consciousness, No Syncope Psych: No Anxiety/Panic, No Depression, no decrease appetite Heme/Lymph: No Bruising, No Bleeding   Past Medical History:  Diagnosis Date  . Arthritis 12/01/2014  . Bladder infection 1/15  . DM (diabetes mellitus) (HCC)   . Endometriosis    on laparoscopy  . Fracture of foot    and toes  . GERD (gastroesophageal reflux disease)   . Osteopenia     Past Surgical History:  Procedure Laterality Date  . CATARACT EXTRACTION W/ INTRAOCULAR LENS IMPLANT Right 02/23/2016  . CATARACT EXTRACTION W/ INTRAOCULAR LENS IMPLANT Left 04/02/2016  . CHOLECYSTECTOMY    . DILATION AND CURETTAGE OF UTERUS    . LAPAROSCOPY       reports that she has never smoked. She has never used smokeless tobacco. She reports that she does not drink alcohol or use drugs.  Allergies  Allergen Reactions  . Codeine Nausea And Vomiting  . Erythromycin     Chest pain    Family History  Problem Relation Age of Onset  . Hypertension Mother   . Hyperlipidemia Mother   . Arthritis Mother   . Anemia Mother   . Heart disease Father   . Diabetes Father   . Congestive Heart  Failure Father   . Ulcers Father   . Anxiety disorder Father   . Hypertension Brother   . Hyperlipidemia Brother   . Bladder Cancer Brother   . COPD Brother   . Diabetes Maternal Grandmother   . Heart attack Maternal Grandmother   . Heart disease Maternal Grandfather   . Hypertension Brother   . Hyperlipidemia Brother   . Alcohol abuse Brother   . Allergy (severe) Sister   . Alcohol abuse Paternal Grandfather      Prior to Admission medications   Medication Sig  Start Date End Date Taking? Authorizing Provider  aspirin 81 MG tablet Take 81 mg by mouth 2 (two) times daily.      [provider]  atorvastatin (LIPITOR) 10 MG tablet Take 10 mg by mouth daily.  03/25/11   [provider]  Calcium-Vitamin D-Vitamin K (VIACTIV) 500-500-40 MG-UNT-MCG CHEW Chew 500 mg by mouth daily.      [provider]  fish oil-omega-3 fatty acids 1000 MG capsule Take 1 g by mouth 4 (four) times daily - after meals and at bedtime.      [provider]  Glucosamine-Chondroitin (OSTEO BI-FLEX REGULAR STRENGTH PO) Take 1 tablet by mouth daily.    [provider]  lisinopril (PRINIVIL,ZESTRIL) 5 MG tablet Take 5 mg by mouth daily.  03/04/11   [provider]  metFORMIN (GLUCOPHAGE-XR) 500 MG 24 hr tablet Take 500 mg by mouth 4 (four) times daily. 2 tablets in the morning and 2 tablets at night 03/04/11   [provider]  metoprolol succinate (TOPROL-XL) 25 MG 24 hr tablet Take 1 tablet by mouth daily. 07/19/16   [provider]  pantoprazole (PROTONIX) 40 MG tablet Take 40 mg by mouth daily.    [provider]    Physical Exam: Vitals:   08/24/19 1752 08/24/19 1800 08/24/19 1802  BP: (!) 185/75 140/75   Pulse: 92 84   Resp: (!) 23 20   Temp:  98.4 F (36.9 C)   TempSrc:  Oral   SpO2: 100% 100%   Weight:   67.1 kg  Height:   5\' 3"  (1.6 m)    Constitutional: NAD, calm, comfortable, well-appearing elderly female laying flat in bed Vitals:   08/24/19 1752 08/24/19 1800 08/24/19 1802  BP: (!) 185/75 140/75   Pulse: 92 84   Resp: (!) 23 20   Temp:  98.4 F (36.9 C)   TempSrc:  Oral   SpO2: 100% 100%   Weight:   67.1 kg  Height:   5\' 3"  (1.6 m)   Eyes: PERRL, lids and conjunctivae normal ENMT: Mucous membranes are moist. .Normal dentition.  Neck: normal, supple Respiratory: clear to auscultation bilaterally, no wheezing, no crackles. Normal respiratory effort on room air. No accessory  muscle use.  Cardiovascular: Regular rate and rhythm, no murmurs / rubs / gallops. No extremity edema. 2+ pedal pulses. No carotid bruits.  Abdomen: no tenderness, no masses palpated.  Bowel sounds positive.  Musculoskeletal: no clubbing / cyanosis. No joint deformity upper and lower extremities. Good ROM, no contractures. Normal muscle tone.  Skin: no rashes, lesions, ulcers. No induration Neurologic: No facial asymmetry.CN 2-12 grossly intact. Sensation intact. Strength 5/5 in all 4.  Intact finger-nose.  Intact heel-to-shin.  Intact alternating rapid movement.  Normal gait. Psychiatric: Normal judgment and insight. Alert and oriented x 3. Normal mood.    Labs on Admission: I have personally reviewed following labs and imaging studies  CBC: Recent  Labs  Lab 08/24/19 1748  WBC 9.3  NEUTROABS 5.8  HGB 13.0  HCT 39.6  MCV 94.7  PLT 244   Basic Metabolic Panel: Recent Labs  Lab 08/24/19 1748  NA 138  K 4.0  CL 103  CO2 24  GLUCOSE 167*  BUN 15  CREATININE 0.74  CALCIUM 9.3   GFR: Estimated Creatinine Clearance: 60.2 mL/min (by C-G formula based on SCr of 0.74 mg/dL). Liver Function Tests: Recent Labs  Lab 08/24/19 1748  AST 16  ALT 20  ALKPHOS 60  BILITOT 0.9  PROT 7.3  ALBUMIN 4.1   No results for input(s): LIPASE, AMYLASE in the last 168 hours. No results for input(s): AMMONIA in the last 168 hours. Coagulation Profile: Recent Labs  Lab 08/24/19 1748  INR 1.1   Cardiac Enzymes: No results for input(s): CKTOTAL, CKMB, CKMBINDEX, TROPONINI in the last 168 hours. BNP (last 3 results) No results for input(s): PROBNP in the last 8760 hours. HbA1C: No results for input(s): HGBA1C in the last 72 hours. CBG: Recent Labs  Lab 08/24/19 1740  GLUCAP 153*   Lipid Profile: No results for input(s): CHOL, HDL, LDLCALC, TRIG, CHOLHDL, LDLDIRECT in the last 72 hours. Thyroid Function Tests: No results for input(s): TSH, T4TOTAL, FREET4, T3FREE, THYROIDAB in the  last 72 hours. Anemia Panel: No results for input(s): VITAMINB12, FOLATE, FERRITIN, TIBC, IRON, RETICCTPCT in the last 72 hours. Urine analysis: No results found for: COLORURINE, APPEARANCEUR, [place], [place], GLUCOSEU, HGBUR, BILIRUBINUR, KETONESUR, PROTEINUR, UROBILINOGEN, NITRITE, LEUKOCYTESUR  Radiological Exams on Admission: CT Angio Head W or Wo Contrast  Result Date: 08/24/2019 CLINICAL DATA:  Headache.  Rule out cerebral hemorrhage. EXAM: CT ANGIOGRAPHY HEAD AND NECK TECHNIQUE: Multidetector CT imaging of the head and neck was performed using the standard protocol during bolus administration of intravenous contrast. Multiplanar CT image reconstructions and MIPs were obtained to evaluate the vascular anatomy. Carotid stenosis measurements (when applicable) are obtained utilizing NASCET criteria, using the distal internal carotid diameter as the denominator. CONTRAST:  116mL OMNIPAQUE IOHEXOL 350 MG/ML SOLN COMPARISON:  CT head earlier today FINDINGS: CT HEAD FINDINGS Brain: Mild atrophy and mild chronic microvascular ischemic change in the white matter. No acute infarct, hemorrhage, mass Vascular: Negative for hyperdense vessel. Skull: Negative Sinuses: Negative Orbits: Bilateral cataract extraction. Review of the MIP images confirms the above findings CTA NECK FINDINGS Aortic arch: Standard branching. Imaged portion shows no evidence of aneurysm or dissection. No significant stenosis of the major arch vessel origins. Right carotid system: Right carotid widely patent without stenosis aneurysm or dissection Left carotid system: Left carotid widely patent without stenosis aneurysm or dissection. Vertebral arteries: Both vertebral arteries patent to the basilar without stenosis. Skeleton: Cervical spondylosis with disc and facet degeneration at multiple levels. No acute skeletal abnormality. Other neck: Negative for mass or adenopathy. Multiple some all subcentimeter thyroid nodules bilaterally. No  further imaging workup necessary. Upper chest: No acute abnormality. Review of the MIP images confirms the above findings CTA HEAD FINDINGS Anterior circulation: Mild atherosclerotic calcification in the cavernous carotid bilaterally without significant stenosis. Anterior and middle cerebral arteries widely patent bilaterally. Posterior circulation: Both vertebral arteries patent to the basilar. PICA patent bilaterally. Basilar widely patent. Superior cerebellar and posterior cerebral arteries patent bilaterally without stenosis. Fetal origin of the right posterior cerebral artery. Venous sinuses: Normal venous enhancement. Anatomic variants: None Review of the MIP images confirms the above findings IMPRESSION: 1. No acute intracranial abnormality. Mild atrophy and mild chronic microvascular ischemic change 2.  No significant carotid or vertebral artery stenosis in the neck. Minimal atherosclerotic disease in the cavernous carotid bilaterally. 3. No intracranial large vessel occlusion or significant stenosis. Electronically Signed   By: Marlan Palau M.D.   On: 08/24/2019 19:14   CT Angio Neck W and/or Wo Contrast  Result Date: 08/24/2019 CLINICAL DATA:  Headache.  Rule out cerebral hemorrhage. EXAM: CT ANGIOGRAPHY HEAD AND NECK TECHNIQUE: Multidetector CT imaging of the head and neck was performed using the standard protocol during bolus administration of intravenous contrast. Multiplanar CT image reconstructions and MIPs were obtained to evaluate the vascular anatomy. Carotid stenosis measurements (when applicable) are obtained utilizing NASCET criteria, using the distal internal carotid diameter as the denominator. CONTRAST:  OMNIPAQUE IOHEXOL 350 MG/ML SOLN COMPARISON:  CT head earlier today FINDINGS: CT HEAD FINDINGS Brain: Mild atrophy and mild chronic microvascular ischemic change in the white matter. No acute infarct, hemorrhage, mass Vascular: Negative for hyperdense vessel. Skull: Negative  Sinuses: Negative Orbits: Bilateral cataract extraction. Review of the MIP images confirms the above findings CTA NECK FINDINGS Aortic arch: Standard branching. Imaged portion shows no evidence of aneurysm or dissection. No significant stenosis of the major arch vessel origins. Right carotid system: Right carotid widely patent without stenosis aneurysm or dissection Left carotid system: Left carotid widely patent without stenosis aneurysm or dissection. Vertebral arteries: Both vertebral arteries patent to the basilar without stenosis. Skeleton: Cervical spondylosis with disc and facet degeneration at multiple levels. No acute skeletal abnormality. Other neck: Negative for mass or adenopathy. Multiple some all subcentimeter thyroid nodules bilaterally. No further imaging workup necessary. Upper chest: No acute abnormality. Review of the MIP images confirms the above findings CTA HEAD FINDINGS Anterior circulation: Mild atherosclerotic calcification in the cavernous carotid bilaterally without significant stenosis. Anterior and middle cerebral arteries widely patent bilaterally. Posterior circulation: Both vertebral arteries patent to the basilar. PICA patent bilaterally. Basilar widely patent. Superior cerebellar and posterior cerebral arteries patent bilaterally without stenosis. Fetal origin of the right posterior cerebral artery. Venous sinuses: Normal venous enhancement. Anatomic variants: None Review of the MIP images confirms the above findings IMPRESSION: 1. No acute intracranial abnormality. Mild atrophy and mild chronic microvascular ischemic change 2. No significant carotid or vertebral artery stenosis in the neck. Minimal atherosclerotic disease in the cavernous carotid bilaterally. 3. No intracranial large vessel occlusion or significant stenosis. Electronically Signed   By: Marlan Palau M.D.   On: 08/24/2019 19:14   DG Chest Portable 1 View  Result Date: 08/24/2019 CLINICAL DATA:  Chest pain,  headache, left arm numbness and tingling EXAM: PORTABLE CHEST 1 VIEW COMPARISON:  Radiograph 05/31/2014 FINDINGS: No consolidation, features of edema, pneumothorax, or effusion. Pulmonary vascularity is normally distributed. The cardiomediastinal contours are unremarkable. No acute osseous or soft tissue abnormality. Degenerative changes are present in the imaged spine and shoulders. Telemetry leads overlie the chest. IMPRESSION: No acute cardiopulmonary abnormality. Electronically Signed   By: Kreg Shropshire M.D.   On: 08/24/2019 18:45   CT ANGIO CHEST AORTA W/CM & OR WO/CM  Result Date: 08/24/2019 CLINICAL DATA:  Chest pain, TIA symptoms, left arm numbness and tingling to, right-sided chest pain EXAM: CT ANGIOGRAPHY CHEST WITH CONTRAST TECHNIQUE: Multidetector CT imaging of the chest was performed using the standard protocol during bolus administration of intravenous contrast. Multiplanar CT image reconstructions and MIPs were obtained to evaluate the vascular anatomy. CONTRAST:  OMNIPAQUE IOHEXOL 350 MG/ML SOLN COMPARISON:  08/24/2019 FINDINGS: Cardiovascular: Thoracic aorta demonstrates normal caliber with no evidence  of aneurysm or dissection. Great vessels are widely patent at their origin, with anatomic appearance of the aortic arch. Mild atherosclerosis of the descending thoracic aorta. The heart is not enlarged. No pericardial effusion. Mediastinum/Nodes: No enlarged mediastinal, hilar, or axillary lymph nodes. Thyroid gland, trachea, and esophagus demonstrate no significant findings. Small hiatal hernia incidentally noted. Lungs/Pleura: No acute airspace disease, effusion, or pneumothorax. The central airways are widely patent. Upper Abdomen: Hypodensity within the caudate lobe measures 5.3 x 3.5 cm. This has been reported on a prior CT from 2002, but the images are unavailable for comparison. There is some evidence of early centripetal enhancement, compatible with hemangioma. There is wide  diastasis of the rectus musculature with fat containing ventral hernia partially visualized. Musculoskeletal: No acute or destructive bony lesions. Reconstructed images demonstrate no additional findings. Review of the MIP images confirms the above findings. IMPRESSION: 1. No evidence of thoracic aortic aneurysm or dissection. 2. No acute airspace disease. 3. A 5.3 cm mass within the caudate lobe of the liver, compatible with a hemangioma. 4. Aortic Atherosclerosis (ICD10-I70.0). Electronically Signed   By: Sharlet Salina M.D.   On: 08/24/2019 19:30   CT HEAD CODE STROKE WO CONTRAST  Result Date: 08/24/2019 CLINICAL DATA:  Code stroke. Acute neuro deficit. Headache with left arm numbness and tingling. Difficulty talking. EXAM: CT HEAD WITHOUT CONTRAST TECHNIQUE: Contiguous axial images were obtained from the base of the skull through the vertex without intravenous contrast. COMPARISON:  None. FINDINGS: Brain: No evidence of acute infarction, hemorrhage, hydrocephalus, extra-axial collection or mass lesion/mass effect. Age-appropriate atrophy. Mild chronic microvascular ischemic change in the frontal white matter bilaterally. Vascular: Negative for hyperdense vessel Skull: Negative Sinuses/Orbits: Paranasal sinuses clear. Bilateral cataract extraction. Other: None ASPECTS (Alberta Stroke Program Early CT Score) - Ganglionic level infarction (caudate, lentiform nuclei, internal capsule, insula, M1-M3 cortex): 7 - Supraganglionic infarction (M4-M6 cortex): 3 Total score (0-10 with 10 being normal): 10 IMPRESSION: 1. Mild atrophy without acute abnormality 2. ASPECTS is 10 3. These results were called by telephone at the time of interpretation on 08/24/2019 at 5:55 pm to provider Willy Eddy , who verbally acknowledged these results. Electronically Signed   By: Marlan Palau M.D.   On: 08/24/2019 17:57    EKG: Independently reviewed.   Assessment/Plan  Transient symptom of Aphasia/Left arm weakness  suspicious for TIA She is outside the window for TPA since she had similar symptoms about 3 weeks ago.  CTA head and neck negative for significant stenosis Will obtain MRI brain Obtain echocardiogram  Continue daily aspirin and atorvastatin Obtain A1c and lipids PT/OT/SLT Frequent neuro checks and keep on telemetry Allow for permissive hypertension with blood pressure treatment as needed only if systolic goes above 220 Need neurology consult in the morning  Insulin-dependent type 2 diabetes Place on sensitive sliding scale  Hypertension Allow for permissive hypertension  Hyperlipidemia Continue statin  DVT prophylaxis:.Lovenox Code Status: Full Family Communication: Plan discussed with patient and husband at bedside  disposition Plan: Home with observation Consults called:  Admission status: Observation  Samar Venneman T Kenyette Gundy DO [facility]   If 7PM-7AM, please contact night-coverage [email or web address]   08/24/2019, 8:08 PM

## 2019-08-24 NOTE — ED Notes (Signed)
Pt expressed anxiety about being in the MRI machine based on past experience. Dr Lenard Lance notified and placed order for 0.5 mg ativan.

## 2019-08-24 NOTE — ED Notes (Signed)
Pt arrived via POV with reports of HA, L arm numbness and tingling and difficulty talking and right side chest pain, states lasted about 15 minutes.  Pt states sxs started between 1 and 2pm.  Pt states last known normal was before 1pm.  Pt states similar sxs started 3 weeks ago.

## 2019-08-25 ENCOUNTER — Observation Stay
Admit: 2019-08-25 | Discharge: 2019-08-25 | Disposition: A | Discharge status: Home / Self Care | Payer: Medicare Other | Attending: Family Medicine | Admitting: Family Medicine

## 2019-08-25 ENCOUNTER — Encounter: Payer: Self-pay | Admitting: Family Medicine

## 2019-08-25 DIAGNOSIS — R4701 Aphasia: Secondary | ICD-10-CM | POA: Diagnosis not present

## 2019-08-25 DIAGNOSIS — E119 Type 2 diabetes mellitus without complications: Secondary | ICD-10-CM | POA: Diagnosis not present

## 2019-08-25 DIAGNOSIS — G459 Transient cerebral ischemic attack, unspecified: Secondary | ICD-10-CM | POA: Diagnosis not present

## 2019-08-25 DIAGNOSIS — Z794 Long term (current) use of insulin: Secondary | ICD-10-CM | POA: Diagnosis not present

## 2019-08-25 LAB — ECHOCARDIOGRAM COMPLETE
Height: 63 in
Weight: 2368 oz

## 2019-08-25 LAB — LIPID PANEL
Cholesterol: 144 mg/dL (ref 0–200)
HDL: 33 mg/dL — ABNORMAL LOW (ref >40)
LDL Cholesterol: 82 mg/dL (ref 0–99)
Total CHOL/HDL Ratio: 4.4 RATIO
Triglycerides: 147 mg/dL (ref <150)
VLDL: 29 mg/dL (ref 0–40)

## 2019-08-25 LAB — SARS CORONAVIRUS 2 (TAT 6-24 HRS): SARS Coronavirus 2: NEGATIVE

## 2019-08-25 LAB — HIV ANTIBODY (ROUTINE TESTING W REFLEX): HIV Screen 4th Generation wRfx: NONREACTIVE

## 2019-08-25 LAB — GLUCOSE, CAPILLARY
Glucose-Capillary: 207 mg/dL — ABNORMAL HIGH (ref 70–99)
Glucose-Capillary: 254 mg/dL — ABNORMAL HIGH (ref 70–99)

## 2019-08-25 MED ORDER — ONDANSETRON HCL 4 MG/2ML IJ SOLN
4.0000 mg | Freq: Four times a day (QID) | INTRAMUSCULAR | Status: DC | PRN
  Filled 2019-08-25: qty 2

## 2019-08-25 MED ORDER — ONDANSETRON 4 MG PO TBDP: 4.0000 mg | ORAL_TABLET | Freq: Three times a day (TID) | ORAL | 0 refills | Status: DC | PRN

## 2019-08-25 NOTE — Discharge Summary (Signed)
5        [place] at [facility]   PATIENT NAME: Amanda Mayo    MR#:  [id number]  DATE OF BIRTH:  April 27, 1951  DATE OF ADMISSION:  08/24/2019   ADMITTING PHYSICIAN: Anselm Jungling, DO  DATE OF DISCHARGE: 08/25/2019  2:41 PM  PRIMARY CARE PHYSICIAN: Patient, No Pcp Per   ADMISSION DIAGNOSIS:  Aphasia [R47.01] TIA (transient ischemic attack) [G45.9] DISCHARGE DIAGNOSIS:  Principal Problem:   Aphasia Active Problems:   Dyslipidemia   Hypertension   DM (diabetes mellitus) (HCC)   TIA (transient ischemic attack)  SECONDARY DIAGNOSIS:   Past Medical History:  Diagnosis Date  . Arthritis 12/01/2014  . Bladder infection 1/15  . DM (diabetes mellitus) (HCC)   . Endometriosis    on laparoscopy  . Fracture of foot    and toes  . GERD (gastroesophageal reflux disease)   . Osteopenia    HOSPITAL COURSE:  Transient symptom of Aphasia/Left arm weakness -her speech symptoms have resolved.  She does have some left arm intermittent weakness/numbness which could be from nerve impingement She is outside the window for TPA since she had similar symptoms about 3 weeks ago.  CTA head and neck negative for significant stenosis MRI brain negative for any acute pathology echocardiogram within normal limits.  Not showing any thrombus Continue daily aspirin and atorvastatin Outpatient neurology evaluation  Insulin-dependent type 2 diabetes Managed with sliding scale while in the hospital  Hypertension Controlled  Hyperlipidemia Continue statin   DISCHARGE CONDITIONS:  Stable CONSULTS OBTAINED:   DRUG ALLERGIES:   Allergies  Allergen Reactions  . Codeine Nausea And Vomiting  . Erythromycin     Chest pain   DISCHARGE MEDICATIONS:   Allergies as of 08/25/2019      Reactions   Codeine Nausea And Vomiting   Erythromycin    Chest pain      Medication List    TAKE these medications   aspirin 81 MG tablet Take 81 mg by mouth daily.   atorvastatin 10 MG  tablet Commonly known as: LIPITOR Take 10 mg by mouth at bedtime.   Fish Oil 645 MG Caps Take 1,290 mg by mouth daily.   insulin lispro protamine-lispro (75-25) 100 UNIT/ML Susp injection Commonly known as: HUMALOG 75/25 MIX Inject 35-40 Units into the skin See admin instructions. Inject 35u under the skin every morning and inject 40u under the skin every night   lisinopril 5 MG tablet Commonly known as: ZESTRIL Take 5 mg by mouth daily.   metFORMIN 500 MG 24 hr tablet Commonly known as: GLUCOPHAGE-XR Take 1,000 mg by mouth in the morning and at bedtime.   metoprolol succinate 25 MG 24 hr tablet Commonly known as: TOPROL-XL Take 12.5 mg by mouth daily.   ondansetron 4 MG disintegrating tablet Commonly known as: Zofran ODT Take 1 tablet (4 mg total) by mouth every 8 (eight) hours as needed for nausea or vomiting.   OSTEO BI-FLEX REGULAR STRENGTH PO Take 1 tablet by mouth daily.   pantoprazole 40 MG tablet Commonly known as: PROTONIX Take 40 mg by mouth daily.   Viactiv 500-500-40 MG-UNT-MCG Chew Generic drug: Calcium-Vitamin D-Vitamin K Chew 500 mg by mouth daily.      DISCHARGE INSTRUCTIONS:   DIET:  Cardiac diet DISCHARGE CONDITION:  Stable ACTIVITY:  Activity as tolerated OXYGEN:  Home Oxygen: No.  Oxygen Delivery: room air DISCHARGE LOCATION:  home   If you experience worsening of your admission symptoms, develop shortness of breath,  life threatening emergency, suicidal or homicidal thoughts you must seek medical attention immediately by calling 911 or calling your MD immediately  if symptoms less severe.  You Must read complete instructions/literature along with all the possible adverse reactions/side effects for all the Medicines you take and that have been prescribed to you. Take any new Medicines after you have completely understood and accpet all the possible adverse reactions/side effects.   Please note  You were cared for by a hospitalist during  your hospital stay. If you have any questions about your discharge medications or the care you received while you were in the hospital after you are discharged, you can call the unit and asked to speak with the hospitalist on call if the hospitalist that took care of you is not available. Once you are discharged, your primary care physician will handle any further medical issues. Please note that NO REFILLS for any discharge medications will be authorized once you are discharged, as it is imperative that you return to your primary care physician (or establish a relationship with a primary care physician if you do not have one) for your aftercare needs so that they can reassess your need for medications and monitor your lab values.    On the day of Discharge:  VITAL SIGNS:  Blood pressure (!) 145/74, pulse 87, temperature 98.7 F (37.1 C), resp. rate 14, height 5\' 3"  (1.6 m), weight 67.1 kg, last menstrual period 07/11/1997, SpO2 97 %. PHYSICAL EXAMINATION:  GENERAL:  69 y.o.-year-old patient lying in the bed with no acute distress.  EYES: Pupils equal, round, reactive to light and accommodation. No scleral icterus. Extraocular muscles intact.  HEENT: Head atraumatic, normocephalic. Oropharynx and nasopharynx clear.  NECK:  Supple, no jugular venous distention. No thyroid enlargement, no tenderness.  LUNGS: Normal breath sounds bilaterally, no wheezing, rales,rhonchi or crepitation. No use of accessory muscles of respiration.  CARDIOVASCULAR: S1, S2 normal. No murmurs, rubs, or gallops.  ABDOMEN: Soft, non-tender, non-distended. Bowel sounds present. No organomegaly or mass.  EXTREMITIES: No pedal edema, cyanosis, or clubbing.  NEUROLOGIC: Cranial nerves II through XII are intact. Muscle strength 5/5 in all extremities. Sensation intact. Gait not checked.  PSYCHIATRIC: The patient is alert and oriented x 3.  SKIN: No obvious rash, lesion, or ulcer.  DATA REVIEW:   CBC Recent Labs  Lab  08/24/19 1748  WBC 9.3  HGB 13.0  HCT 39.6  PLT 308    Chemistries  Recent Labs  Lab 08/24/19 1748  NA 138  K 4.0  CL 103  CO2 24  GLUCOSE 167*  BUN 15  CREATININE 0.74  CALCIUM 9.3  AST 16  ALT 20  ALKPHOS 60  BILITOT 0.9     Outpatient follow-up Follow-up Information    Jacelyn Pi, MD. Go on 08/30/2019.   Specialty: Endocrinology Why: @ 10 am Contact information: [address] [place] [address] [phone]        Vladimir Crofts, MD. Schedule an appointment as soon as possible for a visit in 1 week(s).   Specialty: Neurology Contact information: [place] [facility] [place] [address] [phone]            Management plans discussed with the patient, family and they are in agreement.  CODE STATUS: Full Code   TOTAL TIME TAKING CARE OF THIS PATIENT: 45 minutes.    Max Sane M.D on 08/25/2019 at 4:10 PM  [facility]   CC: Primary care physician;  Patient, No Pcp Per   Note: This dictation was prepared with Dragon dictation along with smaller phrase technology. Any transcriptional errors that result from this process are unintentional.

## 2019-08-25 NOTE — Plan of Care (Signed)
  Problem: Education: Goal: Knowledge of General Education information will improve Description: Including pain rating scale, medication(s)/side effects and non-pharmacologic comfort measures Outcome: Progressing   Problem: Health Behavior/Discharge Planning: Goal: Ability to manage health-related needs will improve Outcome: Progressing   Problem: Clinical Measurements: Goal: Ability to maintain clinical measurements within normal limits will improve Outcome: Progressing Goal: Will remain free from infection Outcome: Progressing Goal: Diagnostic test results will improve Outcome: Progressing Goal: Respiratory complications will improve Outcome: Progressing Goal: Cardiovascular complication will be avoided Outcome: Progressing   Problem: Elimination: Goal: Will not experience complications related to bowel motility Outcome: Progressing Goal: Will not experience complications related to urinary retention Outcome: Progressing   Problem: Safety: Goal: Ability to remain free from injury will improve Outcome: Progressing   Problem: Skin Integrity: Goal: Risk for impaired skin integrity will decrease Outcome: Progressing   Problem: Education: Goal: Knowledge of disease or condition will improve Outcome: Progressing Goal: Knowledge of secondary prevention will improve Outcome: Progressing Goal: Knowledge of patient specific risk factors addressed and post discharge goals established will improve Outcome: Progressing   Problem: Coping: Goal: Will verbalize positive feelings about self Outcome: Progressing Goal: Will identify appropriate support needs Outcome: Progressing   Problem: Health Behavior/Discharge Planning: Goal: Ability to manage health-related needs will improve Outcome: Progressing   Problem: Self-Care: Goal: Ability to participate in self-care as condition permits will improve Outcome: Progressing Goal: Verbalization of feelings and concerns over  difficulty with self-care will improve Outcome: Progressing Goal: Ability to communicate needs accurately will improve Outcome: Progressing   Problem: Nutrition: Goal: Risk of aspiration will decrease Outcome: Progressing   Problem: Ischemic Stroke/TIA Tissue Perfusion: Goal: Complications of ischemic stroke/TIA will be minimized Outcome: Progressing

## 2019-08-25 NOTE — TOC Initial Note (Signed)
Transition of Care [facility]) - Initial/Assessment Note    Patient Details  Name: Amanda Mayo MRN: [id number] Date of Birth: 1950/07/06  [facility] CM/SW Contact:    Allayne Butcher, RN Phone Number: 08/25/2019, 9:42 AM  Clinical Narrative:                 Patient placed under observation for aphasia and TIA.  MRI negative for stroke.  Patient reports not feeling well this morning and reports an episode of emesis.  Patient is from home where she lives with her husband.  Patient reports that she is independent with ADL's and requires no assistive devices.  Patient does not have a PCP at this time but is followed by endocrinology and has an appointment this upcoming Monday.  No discharge needs identified at this time but instructed patient to ask for case manager if she has any questions.   Expected Discharge Plan: Home/Self Care Barriers to Discharge: Continued Medical Work up   Patient Goals and CMS Choice        Expected Discharge Plan and Services Expected Discharge Plan: Home/Self Care       Living arrangements for the past 2 months: Single Family Home                                      Prior Living Arrangements/Services Living arrangements for the past 2 months: Single Family Home Lives with:: Spouse Patient language and need for interpreter reviewed:: Yes Do you feel safe going back to the place where you live?: Yes      Need for Family Participation in Patient Care: Yes (Comment) Care giver support system in place?: Yes (comment)(husband)   Criminal Activity/Legal Involvement Pertinent to Current Situation/Hospitalization: No - Comment as needed  Activities of Daily Living Home Assistive Devices/Equipment: None ADL Screening (condition at time of admission) Patient's cognitive ability adequate to safely complete daily activities?: Yes Is the patient deaf or have difficulty hearing?: No Does the patient have difficulty seeing, even when  wearing glasses/contacts?: No Does the patient have difficulty concentrating, remembering, or making decisions?: No Patient able to express need for assistance with ADLs?: No Does the patient have difficulty dressing or bathing?: No Independently performs ADLs?: Yes (appropriate for developmental age) Does the patient have difficulty walking or climbing stairs?: No Weakness of Legs: None Weakness of Arms/Hands: None  Permission Sought/Granted Permission sought to share information with : Case Manager Permission granted to share information with : Yes, Verbal Permission Granted              Emotional Assessment Appearance:: Appears stated age Attitude/Demeanor/Rapport: Lethargic Affect (typically observed): Accepting, Quiet Orientation: : Oriented to Self, Oriented to Place, Oriented to  Time, Oriented to Situation Alcohol / Substance Use: Not Applicable Psych Involvement: No (comment)  Admission diagnosis:  Aphasia [R47.01] TIA (transient ischemic attack) [G45.9] Patient Active Problem List   Diagnosis Date Noted  . Aphasia 08/24/2019  . Diabetic retinopathy, nonproliferative, mild (HCC) 12/01/2014  . 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 Acid reflux 12/01/2014  . Endometriosis 12/01/2014  . Cataract 12/01/2014  . Cardiac murmur 12/01/2014  . Arthritis 12/01/2014  . Dyslipidemia 04/21/2011  . Hypertension 04/21/2011  . Abnormal EKG 04/21/2011  . DM (diabetes mellitus) (HCC) 04/21/2011   PCP:  Patient, No Pcp Per Pharmacy:   [facility] [address], [place] - [address]  [place] [address] Phone: [phone] Fax: [phone]     Social Determinants of Health (SDOH) Interventions    Readmission Risk Interventions No flowsheet data found.

## 2019-08-25 NOTE — Progress Notes (Signed)
PT Cancellation Note  Patient Details Name: Amanda Mayo MRN: [id number] DOB: [birth year]   Cancelled Treatment:    Reason Eval/Treat Not Completed: Patient declined PT services this date secondary to nausea, nursing aware.  Will attempt to see pt at a future date/time as medically appropriate.     Ovidio Hanger PT, DPT 08/25/19, 10:02 AM

## 2019-08-25 NOTE — Progress Notes (Signed)
SLP Cancellation Note  Patient Details Name: Amanda Mayo MRN: [id number] DOB: [birth year]   Cancelled treatment:       Reason Eval/Treat Not Completed: SLP screened, no needs identified, will sign off(chart reviewed; consulted NSG then met w/ pt in room). Pt denied any difficulty swallowing and is currently on a regular diet; tolerates swallowing pills w/ water per NSG. Pt stated she continues to feel mild N/V feelings but will try some bland foods for Lunch. Pt conversed at conversational level w/out deficits noted; pt and NSG denied any speech-language deficits. Pt stated her speech returned to "normal" w/ "no more stuttering when I got here".  Recommended pt f/u w/ Neurology Outpt for continued discussion and assessment sharing her description of her s/s including Dysfluency d/t this is the 2nd time she has experienced this issue, per pt.   No further skilled ST services indicated as pt appears at her baseline currently w/ no c/o deficits. Pt agreed. NSG to reconsult if any change in status.     Orinda Kenner, MS, CCC-SLP [facility] 08/25/2019, 11:37 AM

## 2019-08-25 NOTE — Progress Notes (Signed)
Patient discharged home via POV. 

## 2019-08-25 NOTE — Plan of Care (Signed)
  Problem: Activity: Goal: Risk for activity intolerance will decrease Outcome: Completed/Met

## 2019-08-25 NOTE — Progress Notes (Signed)
*  PRELIMINARY RESULTS* Echocardiogram 2D Echocardiogram has been performed.  Cristela Blue 08/25/2019, 9:07 AM

## 2019-08-25 NOTE — Plan of Care (Signed)
Problem: Education: Goal: Knowledge of General Education information will improve Description: Including pain rating scale, medication(s)/side effects and non-pharmacologic comfort measures 08/25/2019 1538 by Jenene Slicker, RN Outcome: Adequate for Discharge 08/25/2019 1538 by Jenene Slicker, RN Outcome: Progressing   Problem: Health Behavior/Discharge Planning: Goal: Ability to manage health-related needs will improve 08/25/2019 1538 by Jenene Slicker, RN Outcome: Adequate for Discharge 08/25/2019 1538 by Jenene Slicker, RN Outcome: Progressing   Problem: Clinical Measurements: Goal: Ability to maintain clinical measurements within normal limits will improve 08/25/2019 1538 by Jenene Slicker, RN Outcome: Adequate for Discharge 08/25/2019 1538 by Jenene Slicker, RN Outcome: Progressing Goal: Will remain free from infection 08/25/2019 1538 by Jenene Slicker, RN Outcome: Adequate for Discharge 08/25/2019 1538 by Jenene Slicker, RN Outcome: Progressing Goal: Diagnostic test results will improve 08/25/2019 1538 by Jenene Slicker, RN Outcome: Adequate for Discharge 08/25/2019 1538 by Jenene Slicker, RN Outcome: Progressing Goal: Respiratory complications will improve 08/25/2019 1538 by Jenene Slicker, RN Outcome: Adequate for Discharge 08/25/2019 1538 by Jenene Slicker, RN Outcome: Progressing Goal: Cardiovascular complication will be avoided 08/25/2019 1538 by Jenene Slicker, RN Outcome: Adequate for Discharge 08/25/2019 1538 by Jenene Slicker, RN Outcome: Progressing   Problem: Elimination: Goal: Will not experience complications related to bowel motility 08/25/2019 1538 by Jenene Slicker, RN Outcome: Adequate for Discharge 08/25/2019 1538 by Jenene Slicker, RN Outcome: Progressing Goal: Will not experience complications related to urinary retention 08/25/2019 1538 by Jenene Slicker, RN Outcome: Adequate for Discharge 08/25/2019 1538 by Jenene Slicker, RN Outcome:  Progressing   Problem: Safety: Goal: Ability to remain free from injury will improve 08/25/2019 1538 by Jenene Slicker, RN Outcome: Adequate for Discharge 08/25/2019 1538 by Jenene Slicker, RN Outcome: Progressing   Problem: Skin Integrity: Goal: Risk for impaired skin integrity will decrease 08/25/2019 1538 by Jenene Slicker, RN Outcome: Adequate for Discharge 08/25/2019 1538 by Jenene Slicker, RN Outcome: Progressing   Problem: Education: Goal: Knowledge of disease or condition will improve 08/25/2019 1538 by Jenene Slicker, RN Outcome: Adequate for Discharge 08/25/2019 1538 by Jenene Slicker, RN Outcome: Progressing Goal: Knowledge of secondary prevention will improve 08/25/2019 1538 by Jenene Slicker, RN Outcome: Adequate for Discharge 08/25/2019 1538 by Jenene Slicker, RN Outcome: Progressing Goal: Knowledge of patient specific risk factors addressed and post discharge goals established will improve 08/25/2019 1538 by Jenene Slicker, RN Outcome: Adequate for Discharge 08/25/2019 1538 by Jenene Slicker, RN Outcome: Progressing   Problem: Coping: Goal: Will verbalize positive feelings about self 08/25/2019 1538 by Jenene Slicker, RN Outcome: Adequate for Discharge 08/25/2019 1538 by Jenene Slicker, RN Outcome: Progressing Goal: Will identify appropriate support needs 08/25/2019 1538 by Jenene Slicker, RN Outcome: Adequate for Discharge 08/25/2019 1538 by Jenene Slicker, RN Outcome: Progressing   Problem: Health Behavior/Discharge Planning: Goal: Ability to manage health-related needs will improve 08/25/2019 1538 by Jenene Slicker, RN Outcome: Adequate for Discharge 08/25/2019 1538 by Jenene Slicker, RN Outcome: Progressing   Problem: Self-Care: Goal: Ability to participate in self-care as condition permits will improve 08/25/2019 1538 by Jenene Slicker, RN Outcome: Adequate for Discharge 08/25/2019 1538 by Jenene Slicker, RN Outcome: Progressing Goal:  Verbalization of feelings and concerns over difficulty with self-care will improve 08/25/2019 1538 by Jenene Slicker, RN Outcome: Adequate for Discharge 08/25/2019 1538 by Jenene Slicker, RN Outcome: Progressing Goal: Ability to communicate  needs accurately will improve 08/25/2019 1538 by Orvan Seen, RN Outcome: Adequate for Discharge 08/25/2019 1538 by Orvan Seen, RN Outcome: Progressing   Problem: Nutrition: Goal: Risk of aspiration will decrease 08/25/2019 1538 by Orvan Seen, RN Outcome: Adequate for Discharge 08/25/2019 1538 by Orvan Seen, RN Outcome: Progressing   Problem: Ischemic Stroke/TIA Tissue Perfusion: Goal: Complications of ischemic stroke/TIA will be minimized 08/25/2019 1538 by Orvan Seen, RN Outcome: Adequate for Discharge 08/25/2019 1538 by Orvan Seen, RN Outcome: Progressing

## 2019-08-25 NOTE — Care Management Obs Status (Signed)
MEDICARE OBSERVATION STATUS NOTIFICATION   Patient Details  Name: LARYN VENNING MRN: [id number] Date of Birth: 15-Jun-1950   Medicare Observation Status Notification Given:  Yes    Allayne Butcher, RN 08/25/2019, 9:40 AM

## 2019-08-25 NOTE — Evaluation (Signed)
Physical Therapy Evaluation Patient Details Name: Amanda Mayo MRN: [id number] DOB: March 24, 1951 Today's Date: 08/25/2019   History of Present Illness  Per MD notes: Pt is a 69 y.o. female with medical history significant for hypertension, hyperlipidemia and insulin-dependent type 2 diabetes who presents with concerns of aphasia and left arm numbness.  MD assessment includes: Transient symptom of Aphasia/Left arm weakness suspicious for TIA, DM II, HTN, and HLD.    Clinical Impression  Pt pleasant and motivated to participate during the session.  Pt reported continued feeling of "pins and needles" occasionally down her LUE but only sporadically with no other adverse symptoms noted.  Pt was Ind with all functional mobility tasks including amb 200+ feet and ascending/descending steps with good control and stability throughout.  Pt presented with strength that was WNL and symmetrical to BUEs and BLEs with sensation to light touch and proprioception grossly WNL.  No coordination or visual deficits noted.  Will complete PT orders at this time but will reassess pt pending a change in status upon receipt of new PT orders.       Follow Up Recommendations No PT follow up    Equipment Recommendations  None recommended by PT    Recommendations for Other Services       Precautions / Restrictions Precautions Precautions: None Restrictions Weight Bearing Restrictions: No      Mobility  Bed Mobility Overal bed mobility: Independent                Transfers Overall transfer level: Independent               General transfer comment: good eccentric and concentric control and stability  Ambulation/Gait Ambulation/Gait assistance: Independent Gait Distance (Feet): 200 Feet Assistive device: None Gait Pattern/deviations: WFL(Within Functional Limits) Gait velocity: WNL   General Gait Details: Pt steady including during head turns and start/stops  Stairs Stairs: Yes Stairs  assistance: Independent Stair Management: No rails Number of Stairs: 5 General stair comments: Good control and stability ascending and descending steps without rails  Wheelchair Mobility    Modified Rankin (Stroke Patients Only)       Balance Overall balance assessment: Independent(SLS time > 10 sec on BLEs)                                           Pertinent Vitals/Pain Pain Assessment: 0-10 Pain Score: 1  Pain Location: HA Pain Descriptors / Indicators: Aching Pain Intervention(s): Premedicated before session;Monitored during session    Home Living Family/patient expects to be discharged to:: Private residence Living Arrangements: Spouse/significant other Available Help at Discharge: Family;Available 24 hours/day Type of Home: House Home Access: Stairs to enter Entrance Stairs-Rails: None Entrance Stairs-Number of Steps: 2 Home Layout: One level Home Equipment: None      Prior Function Level of Independence: Independent         Comments: Pt Ind with amb community distances without an AD, no fall history, Ind with ADLs     Hand Dominance   Dominant Hand: Right    Extremity/Trunk Assessment   Upper Extremity Assessment Upper Extremity Assessment: Overall WFL for tasks assessed;RUE deficits/detail;LUE deficits/detail RUE Deficits / Details: Strength and sensation to light touch grossly WNL RUE Sensation: WNL RUE Coordination: WNL LUE Deficits / Details: Strength and sensation to light touch grossly WNL LUE Sensation: WNL LUE Coordination: WNL    Lower Extremity  Assessment Lower Extremity Assessment: Overall WFL for tasks assessed;RLE deficits/detail;LLE deficits/detail RLE Deficits / Details: Strength, proprioception, and sensation to light touch grossly WNL RLE Sensation: WNL LLE Deficits / Details: Strength, proprioception, and sensation to light touch grossly WNL LLE Sensation: WNL       Communication   Communication: No  difficulties  Cognition Arousal/Alertness: Awake/alert Behavior During Therapy: WFL for tasks assessed/performed Overall Cognitive Status: Within Functional Limits for tasks assessed                                        General Comments      Exercises     Assessment/Plan    PT Assessment Patent does not need any further PT services  PT Problem List         PT Treatment Interventions      PT Goals (Current goals can be found in the Care Plan section)  Acute Rehab PT Goals PT Goal Formulation: All assessment and education complete, DC therapy    Frequency     Barriers to discharge        Co-evaluation               AM-PAC PT "6 Clicks" Mobility  Outcome Measure Help needed turning from your back to your side while in a flat bed without using bedrails?: None Help needed moving from lying on your back to sitting on the side of a flat bed without using bedrails?: None Help needed moving to and from a bed to a chair (including a wheelchair)?: None Help needed standing up from a chair using your arms (e.g., wheelchair or bedside chair)?: None Help needed to walk in hospital room?: None Help needed climbing 3-5 steps with a railing? : None 6 Click Score: 24    End of Session Equipment Utilized During Treatment: Gait belt Activity Tolerance: Patient tolerated treatment well Patient left: in chair;with family/visitor present;with call bell/phone within reach Nurse Communication: Mobility status PT Visit Diagnosis: Muscle weakness (generalized) (M62.81)    Time: 1350-1411 PT Time Calculation (min) (ACUTE ONLY): 21 min   Charges:   PT Evaluation $PT Eval Low Complexity: 1 Low          D. Royetta Asal PT, DPT 08/25/19, 2:40 PM

## 2019-08-26 LAB — HEMOGLOBIN A1C
Hgb A1c MFr Bld: 8.9 % — ABNORMAL HIGH (ref 4.8–5.6)
Mean Plasma Glucose: 209 mg/dL

## 2019-09-14 ENCOUNTER — Other Ambulatory Visit (HOSPITAL_COMMUNITY): Payer: Self-pay | Admitting: Neurology

## 2019-09-14 ENCOUNTER — Other Ambulatory Visit: Payer: Self-pay | Admitting: Neurology

## 2019-09-14 DIAGNOSIS — R200 Anesthesia of skin: Secondary | ICD-10-CM

## 2019-09-14 DIAGNOSIS — R202 Paresthesia of skin: Secondary | ICD-10-CM

## 2019-09-25 ENCOUNTER — Ambulatory Visit
Admission: RE | Admit: 2019-09-25 | Discharge: 2019-09-25 | Disposition: A | Discharge status: Home / Self Care | Payer: Medicare Other | Source: Ambulatory Visit | Attending: Neurology | Admitting: Neurology

## 2019-09-25 ENCOUNTER — Other Ambulatory Visit: Payer: Self-pay

## 2019-09-25 DIAGNOSIS — R200 Anesthesia of skin: Secondary | ICD-10-CM | POA: Insufficient documentation

## 2019-09-25 DIAGNOSIS — R202 Paresthesia of skin: Secondary | ICD-10-CM | POA: Diagnosis present

## 2020-10-23 IMAGING — CT CT ANGIO HEAD
3 of 10 series · 16 of 47 positions shown · IV contrast (APPLIED)
Comparison: CT head earlier today

CLINICAL DATA: Headache.  Rule out cerebral hemorrhage.

EXAM:
CT ANGIOGRAPHY HEAD AND NECK
TECHNIQUE: Multidetector CT imaging of the head and neck was performed using
the standard protocol during bolus administration of intravenous
contrast. Multiplanar CT image reconstructions and MIPs were
obtained to evaluate the vascular anatomy. Carotid stenosis
measurements (when applicable) are obtained utilizing NASCET
criteria, using the distal internal carotid diameter as the
denominator.
CONTRAST:  125mL OMNIPAQUE IOHEXOL 350 MG/ML SOLN

[Series 13: ax thin · axial · 0.53mm/px · z∈[+57,+349]mm · 10 of 334 slices shown]
[im 21/334  brain]
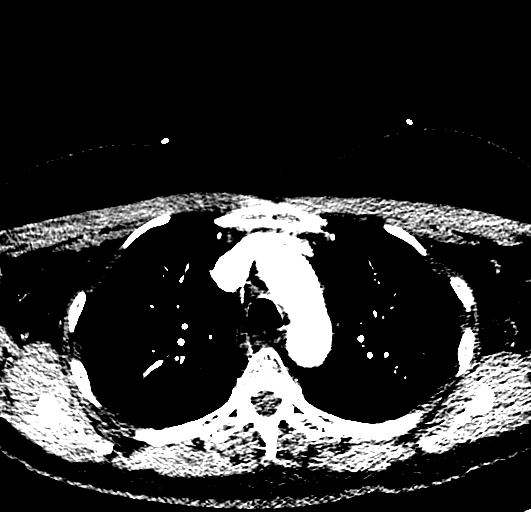
[im 63/334  bone]
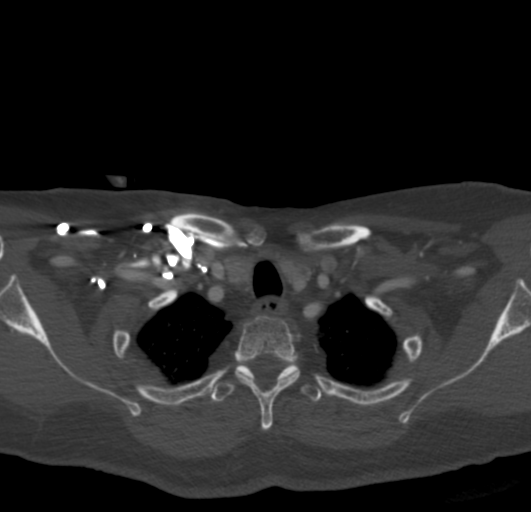
[im 84/334  brain]
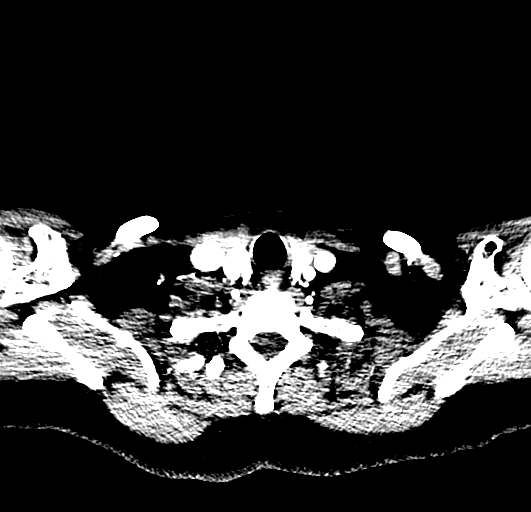
[im 125/334  bone]
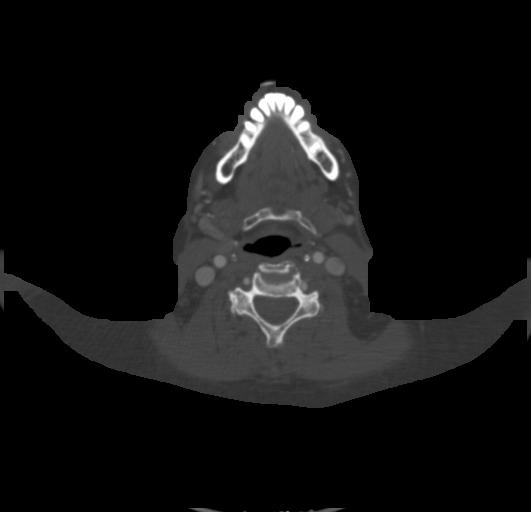
[im 146/334  brain]
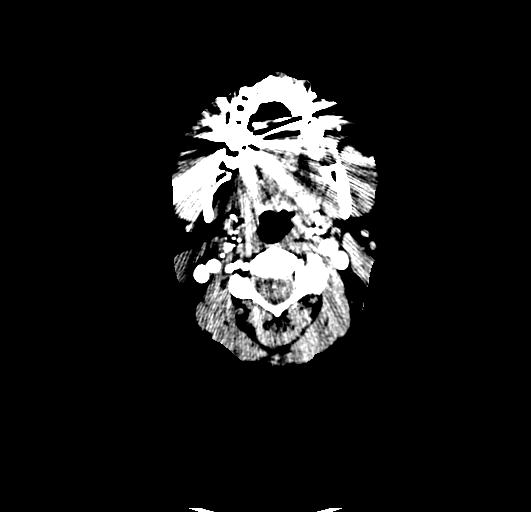
[im 188/334  bone]
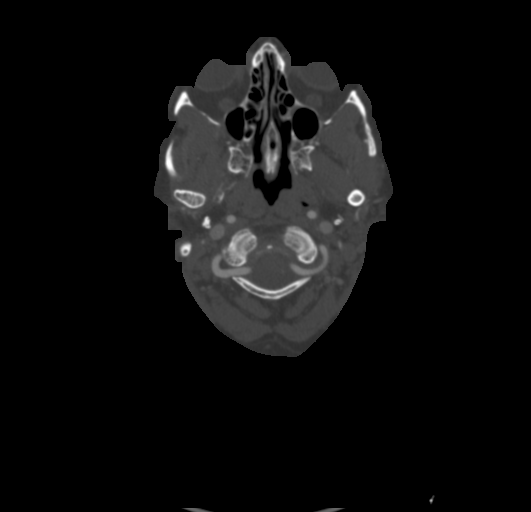
[im 209/334  brain]
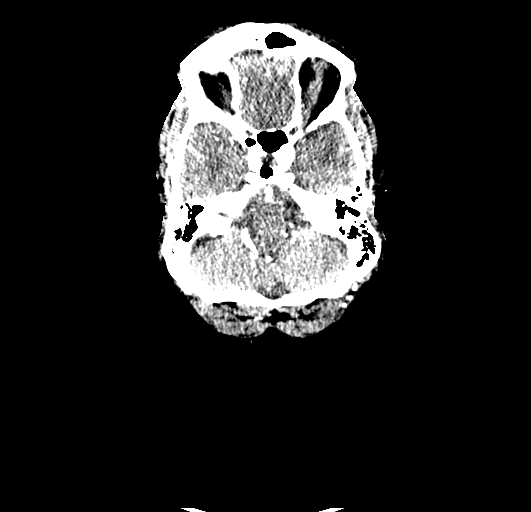
[im 250/334  bone]
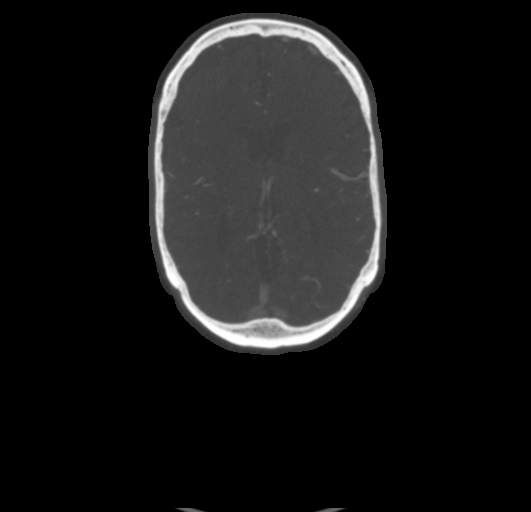
[im 271/334  brain]
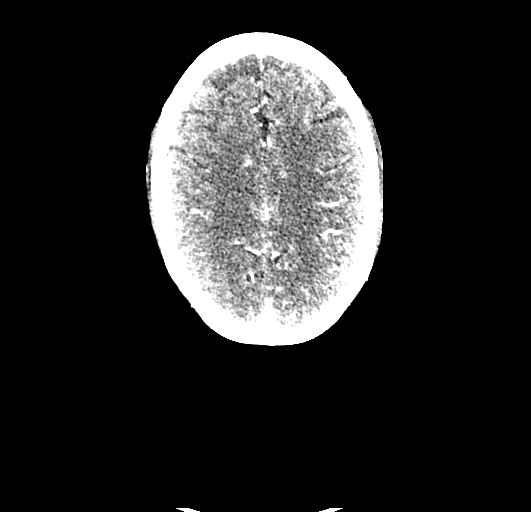
[im 313/334  bone]
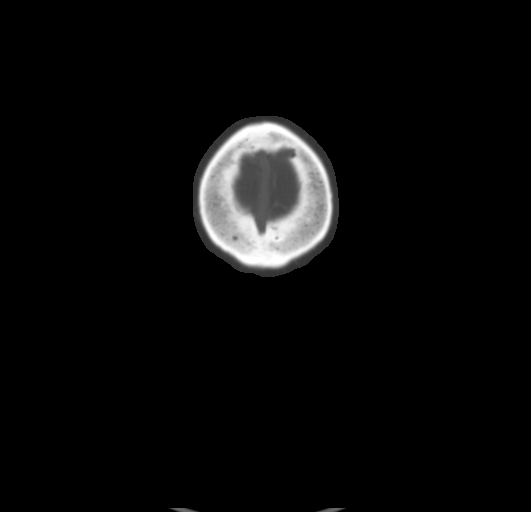

[Series 14: coronal thin · coronal · 0.49mm/px · 3 of 256 slices shown]
[im 86/256  brain]
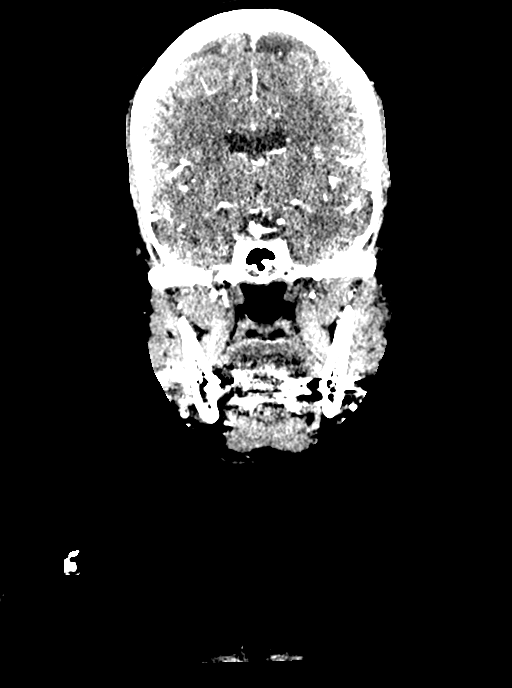
[im 128/256  brain]
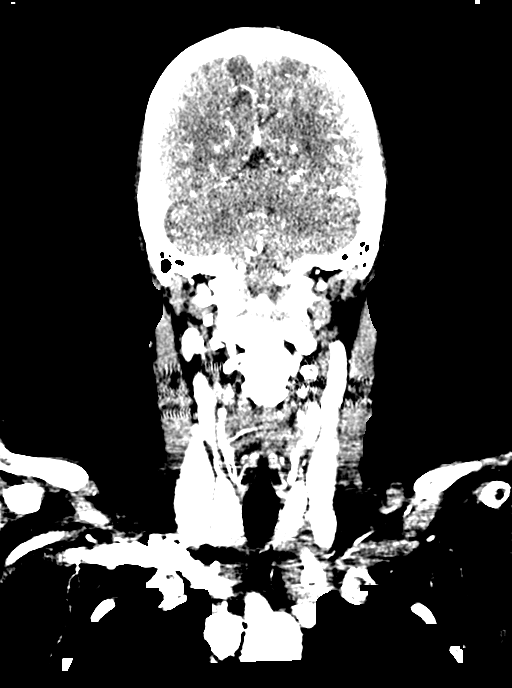
[im 171/256  brain]
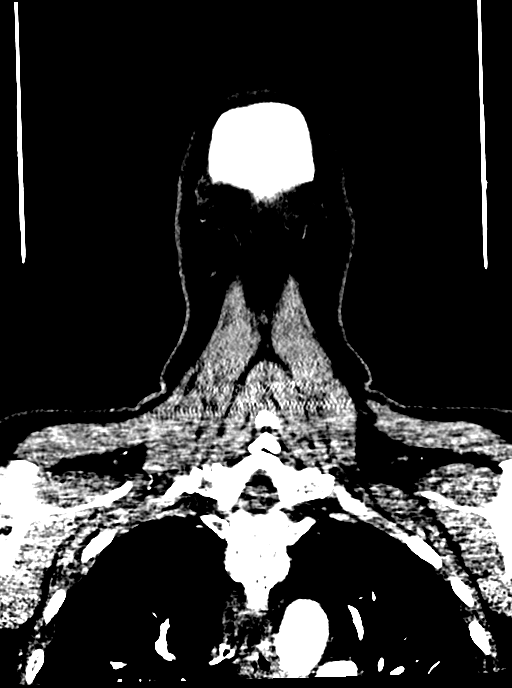

[Series 15: sagittal thin · sagittal · 0.52mm/px · 3 of 173 slices shown]
[im 44/173  brain]
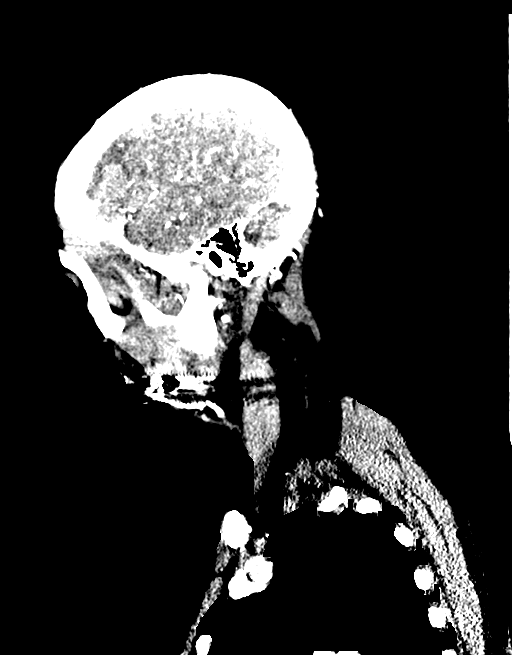
[im 87/173  brain]
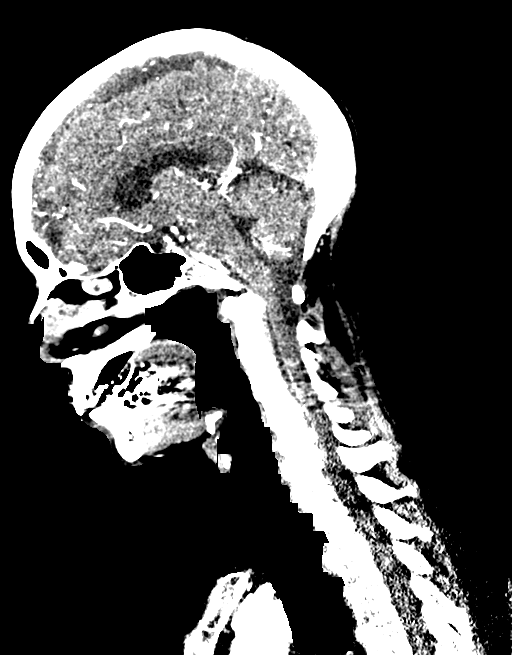
[im 130/173  brain]
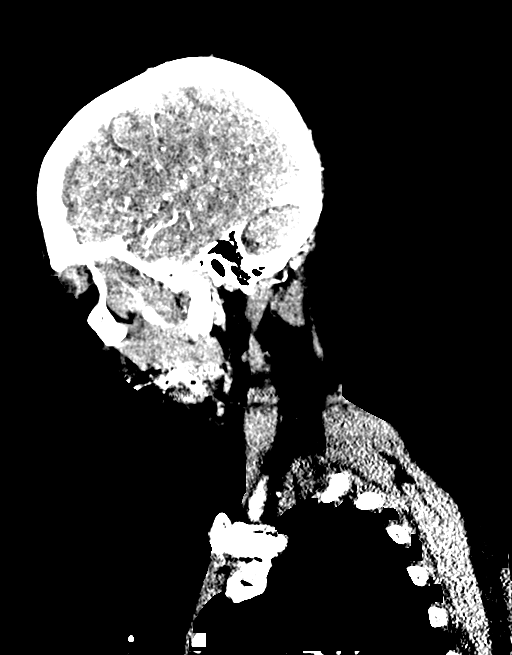

[16 of 47 positions shown; findings below may reference images not displayed]

FINDINGS: CT HEAD FINDINGS

Brain: Mild atrophy and mild chronic microvascular ischemic change
in the white matter. No acute infarct, hemorrhage, mass

Vascular: Negative for hyperdense vessel.

Skull: Negative

Sinuses: Negative

Orbits: Bilateral cataract extraction.

Review of the MIP images confirms the above findings

CTA NECK FINDINGS

Aortic arch: Standard branching. Imaged portion shows no evidence of
aneurysm or dissection. No significant stenosis of the major arch
vessel origins.

Right carotid system: Right carotid widely patent without stenosis
aneurysm or dissection

Left carotid system: Left carotid widely patent without stenosis
aneurysm or dissection.

Vertebral arteries: Both vertebral arteries patent to the basilar
without stenosis.

Skeleton: Cervical spondylosis with disc and facet degeneration at
multiple levels. No acute skeletal abnormality.

Other neck: Negative for mass or adenopathy. Multiple some all
subcentimeter thyroid nodules bilaterally. No further imaging workup
necessary.

Upper chest: No acute abnormality.

Review of the MIP images confirms the above findings

CTA HEAD FINDINGS

Anterior circulation: Mild atherosclerotic calcification in the
cavernous carotid bilaterally without significant stenosis. Anterior
and middle cerebral arteries widely patent bilaterally.

Posterior circulation: Both vertebral arteries patent to the
basilar. PICA patent bilaterally. Basilar widely patent. Superior
cerebellar and posterior cerebral arteries patent bilaterally
without stenosis. Fetal origin of the right posterior cerebral
artery.

Venous sinuses: Normal venous enhancement.

Anatomic variants: None

Review of the MIP images confirms the above findings
IMPRESSION: 1. No acute intracranial abnormality. Mild atrophy and mild chronic
microvascular ischemic change
2. No significant carotid or vertebral artery stenosis in the neck.
Minimal atherosclerotic disease in the cavernous carotid
bilaterally.
3. No intracranial large vessel occlusion or significant stenosis.

## 2022-02-10 ENCOUNTER — Emergency Department: Payer: Medicare Other

## 2022-02-10 ENCOUNTER — Other Ambulatory Visit: Payer: Self-pay

## 2022-02-10 ENCOUNTER — Emergency Department
Admission: EM | Admit: 2022-02-10 | Discharge: 2022-02-10 | Disposition: A | Discharge status: Home / Self Care | Payer: Medicare Other | Attending: Emergency Medicine | Admitting: Emergency Medicine

## 2022-02-10 DIAGNOSIS — M79604 Pain in right leg: Secondary | ICD-10-CM | POA: Diagnosis present

## 2022-02-10 DIAGNOSIS — M7121 Synovial cyst of popliteal space [Baker], right knee: Secondary | ICD-10-CM

## 2022-02-10 DIAGNOSIS — E119 Type 2 diabetes mellitus without complications: Secondary | ICD-10-CM | POA: Diagnosis not present

## 2022-02-10 DIAGNOSIS — I10 Essential (primary) hypertension: Secondary | ICD-10-CM | POA: Diagnosis not present

## 2022-02-10 MED ORDER — MELOXICAM 15 MG PO TABS: 15.0000 mg | ORAL_TABLET | Freq: Every day | ORAL | 2 refills | Status: AC

## 2022-02-10 NOTE — ED Triage Notes (Signed)
Pt to ED POV for "shooting, stinging, hot" pain to front of R leg that started this AM. States woke up and leg was very painful and could not stand on it.  States pain is mostly gone now, but also feels cramps to back of R calf now.  Anterior RLE appears to have varicosities.

## 2022-02-10 NOTE — ED Provider Notes (Signed)
[facility] Provider Note    Event Date/Time   First MD Initiated Contact with Patient 02/10/22 [phone]     (approximate)   History   Leg Pain   HPI  Amanda Mayo is a 71 y.o. female with history of hypertension, diabetes, and as listed in the EMR presents to the emergency department for treatment and evaluation of pain to the right lower extremity that started this morning.  Pain is shooting down into her right calf.  No history of sciatica or similar symptoms.  No history of DVT and not currently on blood thinner.     Physical Exam   Triage Vital Signs: ED Triage Vitals  Enc Vitals Group     BP 02/10/22 0926 (!) 150/76     Pulse Rate 02/10/22 0926 90     Resp 02/10/22 0926 20     Temp 02/10/22 0926 98.3 F (36.8 C)     Temp Source 02/10/22 0926 Oral     SpO2 02/10/22 0926 99 %     Weight 02/10/22 0926 160 lb (72.6 kg)     Height 02/10/22 0926 5\' 3"  (1.6 m)     Head Circumference --      Peak Flow --      Pain Score 02/10/22 0930 7     Pain Loc --      Pain Edu? --      Excl. in [place]? --     Most recent vital signs: Vitals:   02/10/22 0926 02/10/22 1115  BP: (!) 150/76 (!) 148/70  Pulse: 90 88  Resp: 20 18  Temp: 98.3 F (36.8 C)   SpO2: 99% 99%     General: Awake, no distress.  CV:  Good peripheral perfusion.  Resp:  Normal effort.  Abd:  No distention.  Other:  Pain to the popliteal area with radiation into the right calf.  No open wounds or lesions noted.  No erythema   ED Results / Procedures / Treatments   Labs (all labs ordered are listed, but only abnormal results are displayed) Labs Reviewed - No data to display   EKG  Not indicated   RADIOLOGY Ultrasound of the right lower extremity viewed and interpreted by me: No obvious occlusion Radiology report indicates presence of Bakers cyst but no evidence of DVT    PROCEDURES:  Critical Care performed: No  Procedures   MEDICATIONS ORDERED IN  ED: Medications - No data to display   IMPRESSION / MDM / [place] / ED COURSE  I reviewed the triage vital signs and the nursing notes.                              Differential diagnosis includes, but is not limited to, DVT, Baker's cyst, musculoskeletal strain  Patient's presentation is most consistent with acute complicated illness / injury requiring diagnostic workup.  71 year old female presenting to the emergency department for cute onset of right lower extremity pain.  See HPI for further details.  Ultrasound shows Bakers cyst but no evidence of DVT.  Results were discussed with the patient.  Symptomatic treatment and meloxicam advised.  She will follow-up with orthopedics if not improving over the week.  She was encouraged to return to the emergency department for symptoms that change or worsen if she is unable to schedule appointment with primary care or the orthopedist.     FINAL CLINICAL IMPRESSION(S) / ED  DIAGNOSES   Final diagnoses:  Baker's cyst of knee, right     Rx / DC Orders   ED Discharge Orders          Ordered    meloxicam (MOBIC) 15 MG tablet  Daily        02/10/22 1107             Note:  This document was prepared using Dragon voice recognition software and may include unintentional dictation errors.   Chinita Pester, FNP 02/10/22 1258    Pilar Jarvis, MD 02/11/22 [phone]
